# Patient Record
Sex: Female | Born: 1960 | Race: White | Hispanic: No | Marital: Married | Smoking: Current every day smoker
Health system: Southern US, Community
[De-identification: ages and names within clinical notes are randomized; demographics above are authoritative.]

## PROBLEM LIST (undated history)

## (undated) DIAGNOSIS — K859 Acute pancreatitis without necrosis or infection, unspecified: Secondary | ICD-10-CM

## (undated) DIAGNOSIS — J449 Chronic obstructive pulmonary disease, unspecified: Secondary | ICD-10-CM

## (undated) DIAGNOSIS — F1011 Alcohol abuse, in remission: Secondary | ICD-10-CM

## (undated) DIAGNOSIS — F32A Depression, unspecified: Secondary | ICD-10-CM

## (undated) DIAGNOSIS — F329 Major depressive disorder, single episode, unspecified: Secondary | ICD-10-CM

## (undated) DIAGNOSIS — I10 Essential (primary) hypertension: Secondary | ICD-10-CM

## (undated) DIAGNOSIS — F419 Anxiety disorder, unspecified: Secondary | ICD-10-CM

## (undated) HISTORY — PX: TUBAL LIGATION: SHX77

## (undated) HISTORY — DX: Chronic obstructive pulmonary disease, unspecified: J44.9

## (undated) HISTORY — DX: Alcohol abuse, in remission: F10.11

---

## 2000-06-07 ENCOUNTER — Encounter: Admission: RE | Admit: 2000-06-07 | Discharge: 2000-09-05 | Payer: Self-pay | Admitting: Anesthesiology

## 2000-06-15 ENCOUNTER — Encounter: Admission: RE | Admit: 2000-06-15 | Discharge: 2000-09-13 | Payer: Self-pay | Admitting: Anesthesiology

## 2000-09-05 ENCOUNTER — Encounter: Admission: RE | Admit: 2000-09-05 | Discharge: 2000-12-04 | Payer: Self-pay | Admitting: Anesthesiology

## 2014-07-08 ENCOUNTER — Emergency Department (HOSPITAL_COMMUNITY): Payer: 59

## 2014-07-08 ENCOUNTER — Inpatient Hospital Stay (HOSPITAL_COMMUNITY)
Admission: EM | Admit: 2014-07-08 | Discharge: 2014-07-11 | DRG: 440 | Disposition: A | Discharge status: Home / Self Care | Payer: 59 | Attending: Internal Medicine | Admitting: Internal Medicine

## 2014-07-08 ENCOUNTER — Encounter (HOSPITAL_COMMUNITY): Payer: Self-pay | Admitting: Emergency Medicine

## 2014-07-08 DIAGNOSIS — E785 Hyperlipidemia, unspecified: Secondary | ICD-10-CM | POA: Diagnosis present

## 2014-07-08 DIAGNOSIS — R109 Unspecified abdominal pain: Secondary | ICD-10-CM

## 2014-07-08 DIAGNOSIS — K850 Idiopathic acute pancreatitis: Secondary | ICD-10-CM

## 2014-07-08 DIAGNOSIS — Z7901 Long term (current) use of anticoagulants: Secondary | ICD-10-CM

## 2014-07-08 DIAGNOSIS — N19 Unspecified kidney failure: Secondary | ICD-10-CM | POA: Diagnosis present

## 2014-07-08 DIAGNOSIS — F419 Anxiety disorder, unspecified: Secondary | ICD-10-CM | POA: Diagnosis present

## 2014-07-08 DIAGNOSIS — R112 Nausea with vomiting, unspecified: Secondary | ICD-10-CM

## 2014-07-08 DIAGNOSIS — G8929 Other chronic pain: Secondary | ICD-10-CM | POA: Diagnosis present

## 2014-07-08 DIAGNOSIS — Z885 Allergy status to narcotic agent status: Secondary | ICD-10-CM

## 2014-07-08 DIAGNOSIS — K8020 Calculus of gallbladder without cholecystitis without obstruction: Secondary | ICD-10-CM | POA: Diagnosis present

## 2014-07-08 DIAGNOSIS — E781 Pure hyperglyceridemia: Secondary | ICD-10-CM | POA: Diagnosis present

## 2014-07-08 DIAGNOSIS — K861 Other chronic pancreatitis: Secondary | ICD-10-CM | POA: Diagnosis present

## 2014-07-08 DIAGNOSIS — K859 Acute pancreatitis without necrosis or infection, unspecified: Secondary | ICD-10-CM

## 2014-07-08 DIAGNOSIS — K851 Biliary acute pancreatitis: Secondary | ICD-10-CM | POA: Diagnosis present

## 2014-07-08 DIAGNOSIS — F17210 Nicotine dependence, cigarettes, uncomplicated: Secondary | ICD-10-CM | POA: Diagnosis present

## 2014-07-08 DIAGNOSIS — D72829 Elevated white blood cell count, unspecified: Secondary | ICD-10-CM

## 2014-07-08 DIAGNOSIS — I10 Essential (primary) hypertension: Secondary | ICD-10-CM | POA: Diagnosis present

## 2014-07-08 DIAGNOSIS — R1012 Left upper quadrant pain: Secondary | ICD-10-CM

## 2014-07-08 DIAGNOSIS — M545 Low back pain: Secondary | ICD-10-CM | POA: Diagnosis present

## 2014-07-08 HISTORY — DX: Acute pancreatitis without necrosis or infection, unspecified: K85.90

## 2014-07-08 HISTORY — DX: Nausea with vomiting, unspecified: R11.2

## 2014-07-08 LAB — COMPREHENSIVE METABOLIC PANEL
ALT: 61 U/L — AB (ref 0–35)
AST: 44 U/L — AB (ref 0–37)
Albumin: 4 g/dL (ref 3.5–5.2)
Alkaline Phosphatase: 250 U/L — ABNORMAL HIGH (ref 39–117)
Anion gap: 18 — ABNORMAL HIGH (ref 5–15)
BILIRUBIN TOTAL: 0.4 mg/dL (ref 0.3–1.2)
BUN: 6 mg/dL (ref 6–23)
CO2: 23 meq/L (ref 19–32)
Calcium: 9.7 mg/dL (ref 8.4–10.5)
Chloride: 96 mEq/L (ref 96–112)
Creatinine, Ser: 0.59 mg/dL (ref 0.50–1.10)
GFR calc Af Amer: >90 mL/min (ref >90)
GLUCOSE: 151 mg/dL — AB (ref 70–99)
Potassium: 4.5 mEq/L (ref 3.7–5.3)
SODIUM: 137 meq/L (ref 137–147)
Total Protein: 7.9 g/dL (ref 6.0–8.3)

## 2014-07-08 LAB — CBC WITH DIFFERENTIAL/PLATELET
Basophils Absolute: 0 10*3/uL (ref 0.0–0.1)
Basophils Relative: 0 % (ref 0–1)
EOS PCT: 0 % (ref 0–5)
Eosinophils Absolute: 0 10*3/uL (ref 0.0–0.7)
HCT: 39.9 % (ref 36.0–46.0)
Hemoglobin: 13.3 g/dL (ref 12.0–15.0)
LYMPHS ABS: 1.7 10*3/uL (ref 0.7–4.0)
Lymphocytes Relative: 7 % — ABNORMAL LOW (ref 12–46)
MCH: 31.7 pg (ref 26.0–34.0)
MCHC: 33.3 g/dL (ref 30.0–36.0)
MCV: 95 fL (ref 78.0–100.0)
MONO ABS: 0.6 10*3/uL (ref 0.1–1.0)
Monocytes Relative: 2 % — ABNORMAL LOW (ref 3–12)
NEUTROS ABS: 22.1 10*3/uL — AB (ref 1.7–7.7)
Neutrophils Relative %: 91 % — ABNORMAL HIGH (ref 43–77)
Platelets: 402 10*3/uL — ABNORMAL HIGH (ref 150–400)
RBC: 4.2 MIL/uL (ref 3.87–5.11)
RDW: 13.8 % (ref 11.5–15.5)
WBC: 24.5 10*3/uL — AB (ref 4.0–10.5)

## 2014-07-08 LAB — URINE MICROSCOPIC-ADD ON

## 2014-07-08 LAB — LIPASE, BLOOD: Lipase: 1082 U/L — ABNORMAL HIGH (ref 11–59)

## 2014-07-08 LAB — URINALYSIS, ROUTINE W REFLEX MICROSCOPIC
GLUCOSE, UA: NEGATIVE mg/dL
Ketones, ur: 15 mg/dL — AB
Leukocytes, UA: NEGATIVE
Nitrite: NEGATIVE
PH: 6 (ref 5.0–8.0)
Protein, ur: NEGATIVE mg/dL
Specific Gravity, Urine: >1.03 — ABNORMAL HIGH (ref 1.005–1.030)
Urobilinogen, UA: 0.2 mg/dL (ref 0.0–1.0)

## 2014-07-08 MED ORDER — SODIUM CHLORIDE 0.9 % IV SOLN
INTRAVENOUS | Status: DC
  Administered 2014-07-08 – 2014-07-09 (×2): via INTRAVENOUS

## 2014-07-08 MED ORDER — SODIUM CHLORIDE 0.9 % IV BOLUS (SEPSIS)
500.0000 mL | Freq: Once | INTRAVENOUS | Status: AC
  Administered 2014-07-08: 500 mL via INTRAVENOUS

## 2014-07-08 MED ORDER — ONDANSETRON HCL 4 MG/2ML IJ SOLN: 4.0000 mg | Freq: Three times a day (TID) | INTRAMUSCULAR | Status: DC | PRN

## 2014-07-08 MED ORDER — MORPHINE SULFATE 4 MG/ML IJ SOLN
4.0000 mg | Freq: Once | INTRAMUSCULAR | Status: AC
  Administered 2014-07-08: 4 mg via INTRAVENOUS
  Filled 2014-07-08: qty 1

## 2014-07-08 MED ORDER — HYDROMORPHONE HCL 1 MG/ML IJ SOLN: 1.0000 mg | INTRAMUSCULAR | Status: DC | PRN

## 2014-07-08 MED ORDER — ALPRAZOLAM 0.5 MG PO TABS
0.5000 mg | ORAL_TABLET | Freq: Three times a day (TID) | ORAL | Status: DC | PRN
  Administered 2014-07-10: 0.5 mg via ORAL
  Filled 2014-07-08: qty 1

## 2014-07-08 MED ORDER — SODIUM CHLORIDE 0.9 % IV SOLN: INTRAVENOUS | Status: DC

## 2014-07-08 MED ORDER — IOHEXOL 300 MG/ML  SOLN
50.0000 mL | Freq: Once | INTRAMUSCULAR | Status: AC | PRN
  Administered 2014-07-08: 25 mL via ORAL

## 2014-07-08 MED ORDER — HYDROMORPHONE HCL 1 MG/ML IJ SOLN
1.0000 mg | Freq: Once | INTRAMUSCULAR | Status: AC
  Administered 2014-07-08: 1 mg via INTRAVENOUS
  Filled 2014-07-08: qty 1

## 2014-07-08 MED ORDER — ONDANSETRON HCL 4 MG/2ML IJ SOLN
4.0000 mg | Freq: Once | INTRAMUSCULAR | Status: AC
  Administered 2014-07-08: 4 mg via INTRAVENOUS
  Filled 2014-07-08: qty 2

## 2014-07-08 MED ORDER — PAROXETINE HCL 20 MG PO TABS
40.0000 mg | ORAL_TABLET | Freq: Every day | ORAL | Status: DC
  Administered 2014-07-08 – 2014-07-10 (×3): 40 mg via ORAL
  Filled 2014-07-08 (×3): qty 2

## 2014-07-08 MED ORDER — ACETAMINOPHEN 325 MG PO TABS: 650.0000 mg | ORAL_TABLET | Freq: Four times a day (QID) | ORAL | Status: DC | PRN

## 2014-07-08 MED ORDER — HEPARIN SODIUM (PORCINE) 5000 UNIT/ML IJ SOLN
5000.0000 [IU] | Freq: Three times a day (TID) | INTRAMUSCULAR | Status: DC
Start: 2014-07-08 — End: 2014-07-11
  Administered 2014-07-08 – 2014-07-11 (×9): 5000 [IU] via SUBCUTANEOUS
  Filled 2014-07-08 (×9): qty 1

## 2014-07-08 MED ORDER — HYDRALAZINE HCL 20 MG/ML IJ SOLN
10.0000 mg | Freq: Four times a day (QID) | INTRAMUSCULAR | Status: DC | PRN
  Administered 2014-07-09: 10 mg via INTRAVENOUS
  Filled 2014-07-08: qty 1

## 2014-07-08 MED ORDER — SODIUM CHLORIDE 0.9 % IJ SOLN
INTRAMUSCULAR | Status: AC
  Filled 2014-07-08: qty 750

## 2014-07-08 MED ORDER — NICOTINE 21 MG/24HR TD PT24
21.0000 mg | MEDICATED_PATCH | Freq: Every day | TRANSDERMAL | Status: DC
  Administered 2014-07-08 – 2014-07-11 (×4): 21 mg via TRANSDERMAL
  Filled 2014-07-08 (×4): qty 1

## 2014-07-08 MED ORDER — ONDANSETRON HCL 4 MG/2ML IJ SOLN
4.0000 mg | Freq: Four times a day (QID) | INTRAMUSCULAR | Status: DC | PRN
  Administered 2014-07-09: 4 mg via INTRAVENOUS
  Filled 2014-07-08: qty 2

## 2014-07-08 MED ORDER — ACETAMINOPHEN 650 MG RE SUPP: 650.0000 mg | Freq: Four times a day (QID) | RECTAL | Status: DC | PRN

## 2014-07-08 MED ORDER — IOHEXOL 300 MG/ML  SOLN
100.0000 mL | Freq: Once | INTRAMUSCULAR | Status: AC | PRN
  Administered 2014-07-08: 100 mL via INTRAVENOUS

## 2014-07-08 MED ORDER — ONDANSETRON HCL 4 MG PO TABS: 4.0000 mg | ORAL_TABLET | Freq: Four times a day (QID) | ORAL | Status: DC | PRN

## 2014-07-08 MED ORDER — HYDROMORPHONE HCL 1 MG/ML IJ SOLN
1.0000 mg | INTRAMUSCULAR | Status: DC | PRN
  Administered 2014-07-08 – 2014-07-10 (×8): 1 mg via INTRAVENOUS
  Filled 2014-07-08 (×8): qty 1

## 2014-07-08 NOTE — H&P (Signed)
[facility] History and Physical  Hailey BusmanCatherine Darsey WUJ:[id number] DOB: 1961/05/24 DOA: 07/08/2014  Referring physician: Dr. Blinda LeatherwoodPollina ER physician PCP: Selinda FlavinHOWARD, KEVIN, MD   Chief Complaint: Abdominal pain  HPI: Hailey BusmanCatherine Greer is a 53 y.o. female who was in her usual state of health when this morning she developed epigastric size. Umbilical abdominal pain. This was associated with frequent episodes of vomiting. She also describes some loose stools which may have contained some blood. She denies any fever, shortness of breath, chest pain. She has a chronic cough which is unchanged. She denies any dysuria. She's not had any sick contacts. She was evaluated in emergency room where CT scan of the abdomen confirmed evidence of pancreatitis. Lipase was also noted to be greater than 1000. Patient reports that she does not drink any alcohol. She was started on Lipitor approximately 2 months ago. No other new medications. The patient's first episode of pancreatitis was in 03/2013. At that time she was found to have a hemorrhagic pancreatitis and was critically ill. [facility] and was admitted for approximately 2-1/2 weeks. During that hospital course, she was intubated and had developed significant renal failure. Since that episode of pancreatitis, this is the only other episode that she has developed. She'll be admitted for further treatments   Review of Systems:  Pertinent positives as per HPI, otherwise negative  Past Medical History  Diagnosis Date  . Pancreatitis    Past Surgical History  Procedure Laterality Date  . Tubal ligation     Social History:  reports that she has been smoking.  She does not have any smokeless tobacco history on file. She reports that she does not drink alcohol or use illicit drugs.  Allergies  Allergen Reactions  . Codeine     hives    Family history: no family history of pancreatitis   Prior to Admission medications     Medication Sig Start Date End Date Taking? Authorizing Provider  ALPRAZolam Prudy Feeler(XANAX) 0.5 MG tablet Take 0.5 mg by mouth 3 (three) times daily as needed. sleep   Yes Historical Provider, MD  atorvastatin (LIPITOR) 20 MG tablet Take 20 mg by mouth daily.   Yes Historical Provider, MD  PARoxetine (PAXIL) 40 MG tablet Take 40 mg by mouth at bedtime.    Yes Historical Provider, MD   Physical Exam: Filed Vitals:   07/08/14 1513 07/08/14 1600 07/08/14 1727 07/08/14 1730  BP: 192/98 196/102 178/99 183/95  Pulse: 82 82 83 84  Temp:      TempSrc:      Resp: 18  20   Height:      Weight:      SpO2: 100% 96% 98% 97%    Wt Readings from Last 3 Encounters:  07/08/14 63.05 kg (139 lb)    General:  Appears calm and comfortable Eyes: PERRL, normal lids, irises & conjunctiva ENT: grossly normal hearing, lips & tongue Neck: no LAD, masses or thyromegaly Cardiovascular: RRR, no m/r/g. No LE edema. Telemetry: SR, no arrhythmias  Respiratory: CTA bilaterally, no w/r/r. Normal respiratory effort. Abdomen: soft, tenderness in epigastrium/LUQ and periumbilical area, bs+ Skin: no rash or induration seen on limited exam Musculoskeletal: grossly normal tone BUE/BLE Psychiatric: grossly normal mood and affect, speech fluent and appropriate Neurologic: grossly non-focal.          Labs on Admission:  Basic Metabolic Panel:  Recent Labs Lab 07/08/14 1323  NA 137  K 4.5  CL 96  CO2 23  GLUCOSE 151*  BUN 6  CREATININE 0.59  CALCIUM 9.7   Liver Function Tests:  Recent Labs Lab 07/08/14 1323  AST 44*  ALT 61*  ALKPHOS 250*  BILITOT 0.4  PROT 7.9  ALBUMIN 4.0    Recent Labs Lab 07/08/14 1323  LIPASE 1082*   No results for input(s): AMMONIA in the last 168 hours. CBC:  Recent Labs Lab 07/08/14 1323  WBC 24.5*  NEUTROABS 22.1*  HGB 13.3  HCT 39.9  MCV 95.0  PLT 402*   Cardiac Enzymes: No results for input(s): CKTOTAL, CKMB, CKMBINDEX, TROPONINI in the last 168  hours.  BNP (last 3 results) No results for input(s): PROBNP in the last 8760 hours. CBG: No results for input(s): GLUCAP in the last 168 hours.  Radiological Exams on Admission: Ct Abdomen Pelvis W Contrast  07/08/2014   CLINICAL DATA:  Left upper quadrant pain with nausea, vomiting, diarrhea. Patient vomited oral contrast.  EXAM: CT ABDOMEN AND PELVIS WITH CONTRAST  TECHNIQUE: Multidetector CT imaging of the abdomen and pelvis was performed using the standard protocol following bolus administration of intravenous contrast.  CONTRAST:  25mL OMNIPAQUE IOHEXOL 300 MG/ML SOLN, OMNIPAQUE IOHEXOL 300 MG/ML SOLN  COMPARISON:  CT 04/01/2013  FINDINGS: Linear atelectasis or scarring in the right middle lobe and lingula.  Peripancreatic inflammatory change about the neck and body of the pancreas, this appears less severe than on prior exam. There is ill-defined peripancreatic fluid at the pancreatic tail, right and left retroperitoneum. Small amount of free fluid seen in the left upper quadrant of the abdomen. There is no frank organized peripancreatic pseudocyst, however right retroperitoneal collection measures 4.5 x 1.7 x 2.9 cm, is complex with questionable peripheral enhancement and may reflect developing organized collection.  Hepatic steatosis again seen. The high-density material in the gallbladder on prior exam is less well appreciated. Spleen, adrenal glands, and kidneys appear normal. Fluid filled hepatic flexure of the colon may be reactive. Bowel loops are otherwise normal. No obstruction. The abdominal aorta is normal in caliber with mild moderate atherosclerosis.  Within the pelvis the bladder is physiologically distended. There is prominent uterine vascularity. No adnexal mass. No pelvic free fluid or ascites. The appendix is normal.  No acute or suspicious osseous abnormality.  IMPRESSION: 1. Acute pancreatitis. There is fluid in the right and left retroperitoneum and about the tail of the  pancreas. On the right, the fluid is complex with mild peripheral enhancement; question early organized collection. 2. Hepatic steatosis.   Electronically Signed   By: Rubye Oaks M.D.   On: 07/08/2014 16:32    Assessment/Plan Active Problems:   Pancreatitis   Hypertension   Nausea & vomiting   Leukocytosis   Pancreatitis, acute   1. Acute pancreatitis. Etiology is not entirely clear. She denies any alcohol use. Will obtain right upper quadrant ultrasound, although CT scan does not show any evidence of cholelithiasis. Her LFTs are mildly elevated. She's also recently started on Lipitor which in some cases can lead to pancreatitis. We will hold this for now. CT scan of the abdomen and pelvis does indicate developing fluid collections. We will ask gastroenterology to follow. She'll be treated conservatively with bowel rest, kept nothing by mouth and receive IV fluids as well as pain management. Diet will be advanced as tolerated 2. Leukocytosis. Reactive to #1. Continue to follow 3. Nausea and vomiting. Related to #1. Continue antiemetics. 4. Hypertension. Patient has history of hypertension and her blood pressure was likely exacerbated by  pain. We'll continue her home regimen and use when necessary hydralazine.   Code Status: full code DVT Prophylaxis: heparin sq Family Communication: discussed with patient Disposition Plan: discharge home once improved  Time spent: 60mins  [facility] LLCMEMON,JEHANZEB [facility] Pager [phone]

## 2014-07-08 NOTE — ED Provider Notes (Signed)
CSN: [id number]     Arrival date & time 07/08/14  1241 History  This chart was scribed for Gilda Crease, MD  by Abel Presto, ED Scribe. This patient was seen in room APA02/APA02 and the patient's care was started at 1:43 PM.    Chief Complaint  Patient presents with  . Abdominal Pain    Patient is a 53 y.o. female presenting with abdominal pain. The history is provided by the patient. No language interpreter was used.  Abdominal Pain Associated symptoms: chills, cough (mild), diarrhea and vomiting   Associated symptoms: no fever and no sore throat     HPI Comments: Hailey Greer is a 53 y.o. female who presents to the Emergency Department complaining of LUQ and epigastric abdominal pain with onset today. Pt notes associated vomiting, diarrhea with trace blood and chills. Pt notes she has also has a mild cough but denies any other cold symptoms. Pt states pain is similar to previous pancreatitis.  Pt denies PMHx of colitis and diverticulitis.  Pt denies fever.  Past Medical History  Diagnosis Date  . Pancreatitis    Past Surgical History  Procedure Laterality Date  . Tubal ligation     History reviewed. No pertinent family history. History  Substance Use Topics  . Smoking status: Current Every Day Smoker -- 1.00 packs/day  . Smokeless tobacco: Not on file  . Alcohol Use: No   OB History    No data available     Review of Systems  Constitutional: Positive for chills. Negative for fever.  HENT: Negative for rhinorrhea and sore throat.   Respiratory: Positive for cough (mild).   Gastrointestinal: Positive for vomiting, abdominal pain, diarrhea and blood in stool.  All other systems reviewed and are negative.     Allergies  Codeine  Home Medications   Prior to Admission medications   Medication Sig Start Date End Date Taking? Authorizing Provider  ALPRAZolam Prudy Feeler) 0.5 MG tablet Take 0.5 mg by mouth 3 (three) times daily as needed. sleep   Yes  Historical Provider, MD  atorvastatin (LIPITOR) 20 MG tablet Take 20 mg by mouth daily.   Yes Historical Provider, MD  PARoxetine (PAXIL) 40 MG tablet Take 40 mg by mouth at bedtime.    Yes Historical Provider, MD   BP 192/98 mmHg  Pulse 82  Temp(Src) 97.8 F (36.6 C) (Oral)  Resp 18  Ht 5' 3.5" (1.613 m)  Wt 139 lb (63.05 kg)  BMI 24.23 kg/m2  SpO2 100% Physical Exam  Constitutional: She is oriented to person, place, and time. She appears well-developed and well-nourished. No distress.  HENT:  Head: Normocephalic and atraumatic.  Right Ear: Hearing normal.  Left Ear: Hearing normal.  Nose: Nose normal.  Mouth/Throat: Oropharynx is clear and moist and mucous membranes are normal.  Eyes: Conjunctivae and EOM are normal. Pupils are equal, round, and reactive to light.  Neck: Normal range of motion. Neck supple.  Cardiovascular: Regular rhythm, S1 normal and S2 normal.  Exam reveals no gallop and no friction rub.   No murmur heard. Pulmonary/Chest: Effort normal and breath sounds normal. No respiratory distress. She exhibits no tenderness.  Abdominal: Soft. Normal appearance and bowel sounds are normal. There is no hepatosplenomegaly. There is no tenderness. There is no rebound, no guarding, no tenderness at McBurney's point and negative Murphy's sign. No hernia.  Musculoskeletal: Normal range of motion.  Neurological: She is alert and oriented to person, place, and time. She has normal strength. No cranial  nerve deficit or sensory deficit. Coordination normal. GCS eye subscore is 4. GCS verbal subscore is 5. GCS motor subscore is 6.  Skin: Skin is warm, dry and intact. No rash noted. No cyanosis.  Psychiatric: She has a normal mood and affect. Her speech is normal and behavior is normal. Thought content normal.  Nursing note and vitals reviewed.   ED Course  Procedures (including critical care time) DIAGNOSTIC STUDIES: Oxygen Saturation is 100% on room air, normal by my  interpretation.    COORDINATION OF CARE: 1:45 PM Discussed treatment plan with patient at beside, the patient agrees with the plan and has no further questions at this time.   Labs Review Labs Reviewed  CBC WITH DIFFERENTIAL - Abnormal; Notable for the following:    WBC 24.5 (*)    Platelets 402 (*)    Neutrophils Relative % 91 (*)    Neutro Abs 22.1 (*)    Lymphocytes Relative 7 (*)    Monocytes Relative 2 (*)    All other components within normal limits  COMPREHENSIVE METABOLIC PANEL - Abnormal; Notable for the following:    Glucose, Bld 151 (*)    AST 44 (*)    ALT 61 (*)    Alkaline Phosphatase 250 (*)    Anion gap 18 (*)    All other components within normal limits  LIPASE, BLOOD - Abnormal; Notable for the following:    Lipase 1082 (*)    All other components within normal limits  URINALYSIS, ROUTINE W REFLEX MICROSCOPIC - Abnormal; Notable for the following:    APPearance HAZY (*)    Specific Gravity, Urine >1.030 (*)    Hgb urine dipstick SMALL (*)    Bilirubin Urine SMALL (*)    Ketones, ur 15 (*)    All other components within normal limits  URINE MICROSCOPIC-ADD ON - Abnormal; Notable for the following:    Squamous Epithelial / LPF FEW (*)    Bacteria, UA MANY (*)    Casts GRANULAR CAST (*)    All other components within normal limits  POC OCCULT BLOOD, ED    Imaging Review Ct Abdomen Pelvis W Contrast  07/08/2014   CLINICAL DATA:  Left upper quadrant pain with nausea, vomiting, diarrhea. Patient vomited oral contrast.  EXAM: CT ABDOMEN AND PELVIS WITH CONTRAST  TECHNIQUE: Multidetector CT imaging of the abdomen and pelvis was performed using the standard protocol following bolus administration of intravenous contrast.  CONTRAST:  25mL OMNIPAQUE IOHEXOL 300 MG/ML SOLN, 100mL OMNIPAQUE IOHEXOL 300 MG/ML SOLN  COMPARISON:  CT 04/01/2013  FINDINGS: Linear atelectasis or scarring in the right middle lobe and lingula.  Peripancreatic inflammatory change about the neck  and body of the pancreas, this appears less severe than on prior exam. There is ill-defined peripancreatic fluid at the pancreatic tail, right and left retroperitoneum. Small amount of free fluid seen in the left upper quadrant of the abdomen. There is no frank organized peripancreatic pseudocyst, however right retroperitoneal collection measures 4.5 x 1.7 x 2.9 cm, is complex with questionable peripheral enhancement and may reflect developing organized collection.  Hepatic steatosis again seen. The high-density material in the gallbladder on prior exam is less well appreciated. Spleen, adrenal glands, and kidneys appear normal. Fluid filled hepatic flexure of the colon may be reactive. Bowel loops are otherwise normal. No obstruction. The abdominal aorta is normal in caliber with mild moderate atherosclerosis.  Within the pelvis the bladder is physiologically distended. There is prominent uterine vascularity. No adnexal mass.  No pelvic free fluid or ascites. The appendix is normal.  No acute or suspicious osseous abnormality.  IMPRESSION: 1. Acute pancreatitis. There is fluid in the right and left retroperitoneum and about the tail of the pancreas. On the right, the fluid is complex with mild peripheral enhancement; question early organized collection. 2. Hepatic steatosis.   Electronically Signed   By: Rubye OaksMelanie  Ehinger M.D.   On: 07/08/2014 16:32     EKG Interpretation None      MDM   Final diagnoses:  Abdominal pain  Acute pancreatitis, unspecified pancreatitis type   She presents to the ER for evaluation of abdominal pain. Patient having pain diffusely on the left side of her abdomen that radiates into her back. She has had only partial improvement with multiple doses of morphine and Zofran. Lipase is above 1000. CT scan shows uncomplicated pancreatitis. Patient reports a previous history of pancreatitis, unclear etiology. She will be admitted to the hospital for further management.  I personally  performed the services described in this documentation, which was scribed in my presence. The recorded information has been reviewed and is accurate.      Gilda Creasehristopher J. Pollina, MD 07/08/14 [phone]

## 2014-07-08 NOTE — ED Notes (Signed)
Dr Blinda LeatherwoodPollina informed to elevated BP.  See new orders.

## 2014-07-08 NOTE — ED Notes (Signed)
Tammy in CT notified pt vomited contrast back.

## 2014-07-08 NOTE — ED Notes (Signed)
Oral contrast completed.

## 2014-07-08 NOTE — ED Notes (Addendum)
History of pancreatitis.  Pain started this am to left upper abdomen, rates pain 6.  Pain to mid back, rates pain 4.  Took Xanax one tablet at 09:00.

## 2014-07-08 NOTE — ED Notes (Signed)
Pt reports abdominal pain,vomiting and blood tinged diarrhea since this am.

## 2014-07-09 ENCOUNTER — Inpatient Hospital Stay (HOSPITAL_COMMUNITY): Payer: 59

## 2014-07-09 DIAGNOSIS — R112 Nausea with vomiting, unspecified: Secondary | ICD-10-CM

## 2014-07-09 DIAGNOSIS — K859 Acute pancreatitis, unspecified: Secondary | ICD-10-CM

## 2014-07-09 DIAGNOSIS — R1013 Epigastric pain: Secondary | ICD-10-CM

## 2014-07-09 DIAGNOSIS — K851 Biliary acute pancreatitis: Principal | ICD-10-CM

## 2014-07-09 LAB — COMPREHENSIVE METABOLIC PANEL
ALT: 43 U/L — AB (ref 0–35)
AST: 27 U/L (ref 0–37)
Albumin: 3.5 g/dL (ref 3.5–5.2)
Alkaline Phosphatase: 175 U/L — ABNORMAL HIGH (ref 39–117)
Anion gap: 9 (ref 5–15)
BUN: <5 mg/dL — ABNORMAL LOW (ref 6–23)
CALCIUM: 9.1 mg/dL (ref 8.4–10.5)
CO2: 25 mmol/L (ref 19–32)
Chloride: 100 mEq/L (ref 96–112)
Creatinine, Ser: 0.57 mg/dL (ref 0.50–1.10)
GFR calc non Af Amer: >90 mL/min (ref >90)
Glucose, Bld: 122 mg/dL — ABNORMAL HIGH (ref 70–99)
Potassium: 3.3 mmol/L — ABNORMAL LOW (ref 3.5–5.1)
SODIUM: 134 mmol/L — AB (ref 135–145)
TOTAL PROTEIN: 7 g/dL (ref 6.0–8.3)
Total Bilirubin: 0.7 mg/dL (ref 0.3–1.2)

## 2014-07-09 LAB — CBC
HCT: 37.9 % (ref 36.0–46.0)
HEMOGLOBIN: 12.8 g/dL (ref 12.0–15.0)
MCH: 31.5 pg (ref 26.0–34.0)
MCHC: 33.8 g/dL (ref 30.0–36.0)
MCV: 93.3 fL (ref 78.0–100.0)
Platelets: 364 10*3/uL (ref 150–400)
RBC: 4.06 MIL/uL (ref 3.87–5.11)
RDW: 13.7 % (ref 11.5–15.5)
WBC: 20.7 10*3/uL — AB (ref 4.0–10.5)

## 2014-07-09 LAB — LIPID PANEL
CHOLESTEROL: 229 mg/dL — AB (ref 0–200)
HDL: 64 mg/dL (ref >39)
LDL Cholesterol: UNDETERMINED mg/dL (ref 0–99)
TRIGLYCERIDES: 592 mg/dL — AB (ref <150)
Total CHOL/HDL Ratio: 3.6 RATIO
VLDL: UNDETERMINED mg/dL (ref 0–40)

## 2014-07-09 LAB — LIPASE, BLOOD: LIPASE: 353 U/L — AB (ref 11–59)

## 2014-07-09 MED ORDER — CEFTRIAXONE SODIUM IN DEXTROSE 20 MG/ML IV SOLN
1.0000 g | INTRAVENOUS | Status: DC
  Administered 2014-07-09 – 2014-07-11 (×3): 1 g via INTRAVENOUS
  Filled 2014-07-09 (×7): qty 50

## 2014-07-09 MED ORDER — SODIUM CHLORIDE 0.9 % IV SOLN
INTRAVENOUS | Status: DC
  Filled 2014-07-09 (×3): qty 1000

## 2014-07-09 MED ORDER — POTASSIUM CHLORIDE 10 MEQ/100ML IV SOLN
10.0000 meq | INTRAVENOUS | Status: AC
  Administered 2014-07-09 (×3): 10 meq via INTRAVENOUS
  Filled 2014-07-09 (×2): qty 100

## 2014-07-09 MED ORDER — POTASSIUM CHLORIDE IN NACL 40-0.9 MEQ/L-% IV SOLN
INTRAVENOUS | Status: DC
  Administered 2014-07-09 – 2014-07-11 (×3): 100 mL/h via INTRAVENOUS

## 2014-07-09 NOTE — Progress Notes (Signed)
UR chart review completed.  

## 2014-07-09 NOTE — Care Management Note (Addendum)
    Page 1 of 1   07/11/2014     1:48:28 PM CARE MANAGEMENT NOTE 07/11/2014  Patient:  Hailey Greer,Hailey Greer   Account Number:  [id number]  Date Initiated:  07/09/2014  Documentation initiated by:  Sharrie RothmanBLACKWELL,Kennedy Bohanon C  Subjective/Objective Assessment:   Pt admitted from home with pancreatitis. Pt lives with her husband and will returnhome at discharge. Pt is independent with ADL's.     Action/Plan:   No CM needs noted.   Anticipated DC Date:  07/11/2014   Anticipated DC Plan:  HOME/SELF CARE      DC Planning Services  CM consult      Choice offered to / List presented to:             Status of service:  Completed, signed off Medicare Important Message given?   (If response is "NO", the following Medicare IM given date fields will be blank) Date Medicare IM given:   Medicare IM given by:   Date Additional Medicare IM given:   Additional Medicare IM given by:    Discharge Disposition:  HOME/SELF CARE  Per UR Regulation:    If discussed at Long Length of Stay Meetings, dates discussed:    Comments:  07/11/14 1345 Arlyss Queenammy Chiquitta Matty, RN BSN CM Pt discharged home today. No Cm needs noted.  07/09/14 1410 Arlyss Queenammy Kahlan Engebretson, RN BSN CM

## 2014-07-09 NOTE — Progress Notes (Signed)
[facility] PROGRESS NOTE  Hailey BusmanCatherine Greer ZOX:[id number] DOB: 10/20/1960 DOA: 07/08/2014 PCP: Selinda FlavinHOWARD, KEVIN, MD  Assessment/Plan: 1. Acute biliary pancreatitis. Ultrasound of the gallbladder indicates evidence of cholelithiasis. She will likely need elective cholecystectomy. She's been seen by general surgery with recommendations for outpatient follow-up. Lipase is currently trending down. She'll be kept nothing by mouth for today and likely start clear liquids tomorrow. Continue supportive management with IV fluids and pain management. 2. Hypertension. Continue current treatments. Blood pressure stable. 3. Nausea and vomiting. Related to #1. Improving. 4. Leukocytosis. Reactive to #1. Improving. Continue to follow  Code Status: full code Family Communication: discussed with patient and husband at the bedside Disposition Plan: discharge home once improved   Consultants:  Gastroenterology  General Surgery  Procedures:    Antibiotics:    HPI/Subjective: Feeling better today. Abdominal pain has improved, no vomiting  Objective: Filed Vitals:   07/09/14 1429  BP: 162/74  Pulse: 92  Temp: 98.4 F (36.9 C)  Resp: 18    Intake/Output Summary (Last 24 hours) at 07/09/14 1511 Last data filed at 07/09/14 1435  Gross per 24 hour  Intake 1060.42 ml  Output    502 ml  Net 558.42 ml   Filed Weights   07/08/14 1248 07/08/14 1944  Weight: 63.05 kg (139 lb) 63.05 kg (139 lb)    Exam:   General:  NAD  Cardiovascular: S1, S2 RRR  Respiratory: CTA B  Abdomen: soft, mild tenderness in LUQ/epigastrium, bs+  Musculoskeletal: no edema b/l   Data Reviewed: Basic Metabolic Panel:  Recent Labs Lab 07/08/14 1323 07/09/14 0540  NA 137 134*  K 4.5 3.3*  CL 96 100  CO2 23 25  GLUCOSE 151* 122*  BUN 6 <5*  CREATININE 0.59 0.57  CALCIUM 9.7 9.1   Liver Function Tests:  Recent Labs Lab 07/08/14 1323 07/09/14 0540  AST 44* 27  ALT 61* 43*  ALKPHOS 250*  175*  BILITOT 0.4 0.7  PROT 7.9 7.0  ALBUMIN 4.0 3.5    Recent Labs Lab 07/08/14 1323 07/09/14 0540  LIPASE 1082* 353*   No results for input(s): AMMONIA in the last 168 hours. CBC:  Recent Labs Lab 07/08/14 1323 07/09/14 0540  WBC 24.5* 20.7*  NEUTROABS 22.1*  --   HGB 13.3 12.8  HCT 39.9 37.9  MCV 95.0 93.3  PLT 402* 364   Cardiac Enzymes: No results for input(s): CKTOTAL, CKMB, CKMBINDEX, TROPONINI in the last 168 hours. BNP (last 3 results) No results for input(s): PROBNP in the last 8760 hours. CBG: No results for input(s): GLUCAP in the last 168 hours.  No results found for this or any previous visit (from the past 240 hour(s)).   Studies: Ct Abdomen Pelvis W Contrast  07/08/2014   CLINICAL DATA:  Left upper quadrant pain with nausea, vomiting, diarrhea. Patient vomited oral contrast.  EXAM: CT ABDOMEN AND PELVIS WITH CONTRAST  TECHNIQUE: Multidetector CT imaging of the abdomen and pelvis was performed using the standard protocol following bolus administration of intravenous contrast.  CONTRAST:  25mL OMNIPAQUE IOHEXOL 300 MG/ML SOLN, 100mL OMNIPAQUE IOHEXOL 300 MG/ML SOLN  COMPARISON:  CT 04/01/2013  FINDINGS: Linear atelectasis or scarring in the right middle lobe and lingula.  Peripancreatic inflammatory change about the neck and body of the pancreas, this appears less severe than on prior exam. There is ill-defined peripancreatic fluid at the pancreatic tail, right and left retroperitoneum. Small amount of free fluid seen in the left upper quadrant of the abdomen. There  is no frank organized peripancreatic pseudocyst, however right retroperitoneal collection measures 4.5 x 1.7 x 2.9 cm, is complex with questionable peripheral enhancement and may reflect developing organized collection.  Hepatic steatosis again seen. The high-density material in the gallbladder on prior exam is less well appreciated. Spleen, adrenal glands, and kidneys appear normal. Fluid filled  hepatic flexure of the colon may be reactive. Bowel loops are otherwise normal. No obstruction. The abdominal aorta is normal in caliber with mild moderate atherosclerosis.  Within the pelvis the bladder is physiologically distended. There is prominent uterine vascularity. No adnexal mass. No pelvic free fluid or ascites. The appendix is normal.  No acute or suspicious osseous abnormality.  IMPRESSION: 1. Acute pancreatitis. There is fluid in the right and left retroperitoneum and about the tail of the pancreas. On the right, the fluid is complex with mild peripheral enhancement; question early organized collection. 2. Hepatic steatosis.   Electronically Signed   By: Rubye OaksMelanie  Ehinger M.D.   On: 07/08/2014 16:32   [place] Abdomen Limited Ruq  07/09/2014   CLINICAL DATA:  Possible pancreatitis, evaluate for common bile duct calculi  EXAM: US ABDOMEN LIMITED - RIGHT UPPER QUADRANT  COMPARISON:  CT abdomen pelvis of 07/08/2014  FINDINGS: Gallbladder:  There is a single non mobile echogenic focus within the gallbladder of 1.1 cm most consistent with gallstone with shadowing. The gallbladder wall is not thickened and there is no pain over the gallbladder with compression.  Common bile duct:  Diameter: The common bile duct is normal measuring 4.4 mm in diameter. No common bile duct calculi are seen by ultrasound.  Liver:  The liver is somewhat echogenic consistent with fatty infiltration. A small amount of fluid is noted just below the liver which may be related to the patient's recent history of pancreatitis.  Bowel gas does obscure some anatomic detail.  IMPRESSION: 1. At least one gallstone of 1.1 cm. No ultrasound evidence of acute cholecystitis is seen. 2. Probable fatty infiltration of the liver. 3. Small amount of fluid caudal to the liver may be related to the patient's recent history of pancreatitis.   Electronically Signed   By: Dwyane DeePaul  Barry M.D.   On: 07/09/2014 10:33    Scheduled Meds: . cefTRIAXone  (ROCEPHIN)  IV  1 g Intravenous Q24H  . heparin  5,000 Units Subcutaneous 3 times per day  . nicotine  21 mg Transdermal Daily  . PARoxetine  40 mg Oral QHS   Continuous Infusions: . sodium chloride 125 mL/hr at 07/09/14 [id number]    Active Problems:   Pancreatitis   Hypertension   Nausea & vomiting   Leukocytosis   Pancreatitis, acute    Time spent:5225mins    MEMON,JEHANZEB  [facility] Pager [phone]. If 7PM-7AM, please contact night-coverage at [email or web address], password [email or web address] 07/09/2014, 3:11 PM  LOS: 1 day

## 2014-07-09 NOTE — Consult Note (Signed)
Referring Provider: Erick BlinksJehanzeb Memon, MD Primary Care Physician:  Selinda FlavinHOWARD, KEVIN, MD Primary Gastroenterologist:  Dr. Karilyn Cotaehman  Reason for Consultation:    Acute pancreatitis.  HPI:   Patient is 53 year old Caucasian female who has history of fulminant pancreatitis(September 2014 for which [facility]) with full recovery was in usual state of felt until yesterday morning when she woke up with pain in epigastrium and right upper quadrant. She felt fine when she went to bed night before. Pain increased in intensity and was associated with nausea vomiting and she also had few loose stools. By this time she needed she had pancreatitis. She was therefore brought to emergency room. Evaluation in emergency room revealed leukocytosis serum lipase of greater than 1000, mildly elevated AST and ALT. Abdominopelvic CT with contrast revealed peripancreatic inflammatory changes surrounding neck and body of pancreas there was ill-defined peripancreatic fluid at pancreatic tail right and left retroperitoneum and fluid collection in right retroperitoneum measuring 45 17 x 29 mm. Patient was admitted to hospitalist service and begun on IV fluids and made nothing by mouth. She just had an ultrasound which have reviewed and it reveals cholelithiasis and normal size CBD without evidence of choledocholithiasis. Patient feels much better. She feels pain is well controlled with medication. She now complains of mild pain or soreness across upper abdomen. She did vomit bilious material during the night. She denies chest pain or shortness of breath. Is also no history of fever or chills. She states she passed a small amount of fresh blood with her bowel movements yesterday when she had diarrhea. She has had similar episode or 2 in the past. She is never been screened for CRC to Dr. Selinda FlavinKevin Greer as recommended but she is declined. Patient has had good appetite she denies recent weight loss. He did lose over 10 pounds  when she was sick last year but she has gained it all back. She does not drink alcohol. Family studies negative for pancreatitis. Patient smokes about a pack of cigarettes per day. All in all she smoked for more than 25 years. At one point she quit for 5 years. She has 2 grownup son in good health.     Past Medical History  Diagnosis Date  .  fulminant pancreatitis back in September 2014; she [facility] [place] [facility] for [facility]2 [facility]. She did receive 3 units of PRBCs. She had multiple CTs in the last one was in March 2015 revealing significant decrease in the size of fluid collections.      Hyperlipidemia. She has elevation of the last straw as well as triglycerides.    Per conversation with Dr. Selinda FlavinKevin Greer triglyceride level was 770 at its peak never above 1000.  History of anxiety.    Chronic low back pain. MRI was negative according to patient and she was felt to have fibromyalgia.  Past Surgical History  Procedure Laterality Date  . Tubal ligation      Prior to Admission medications   Medication Sig Start Date End Date Taking? Authorizing Provider  ALPRAZolam Prudy Feeler(XANAX) 0.5 MG tablet Take 0.5 mg by mouth 3 (three) times daily as needed. sleep   Yes Historical Provider, MD  atorvastatin (LIPITOR) 20 MG tablet Take 20 mg by mouth daily.   Yes Historical Provider, MD  PARoxetine (PAXIL) 40 MG tablet Take 40 mg by mouth at bedtime.    Yes Historical Provider, MD    Current Facility-Administered Medications  Medication Dose  Route Frequency Provider Last Rate Last Dose  . 0.9 %  sodium chloride infusion   Intravenous Continuous Erick Blinks, MD 125 mL/hr at 07/09/14 0554    . acetaminophen (TYLENOL) tablet 650 mg  650 mg Oral Q6H PRN Erick Blinks, MD       Or  . acetaminophen (TYLENOL) suppository 650 mg  650 mg Rectal Q6H PRN Erick Blinks, MD      . ALPRAZolam Prudy Feeler) tablet 0.5 mg  0.5 mg Oral TID PRN Erick Blinks, MD      . heparin injection 5,000  Units  5,000 Units Subcutaneous 3 times per day Erick Blinks, MD   5,000 Units at 07/09/14 0554  . hydrALAZINE (APRESOLINE) injection 10 mg  10 mg Intravenous Q6H PRN Erick Blinks, MD   10 mg at 07/09/14 0039  . HYDROmorphone (DILAUDID) injection 1 mg  1 mg Intravenous Q4H PRN Erick Blinks, MD   1 mg at 07/09/14 0755  . nicotine (NICODERM CQ - dosed in mg/24 hours) patch 21 mg  21 mg Transdermal Daily Gilda Crease, MD   21 mg at 07/08/14 1738  . ondansetron (ZOFRAN) tablet 4 mg  4 mg Oral Q6H PRN Erick Blinks, MD       Or  . ondansetron (ZOFRAN) injection 4 mg  4 mg Intravenous Q6H PRN Erick Blinks, MD   4 mg at 07/09/14 0327  . PARoxetine (PAXIL) tablet 40 mg  40 mg Oral QHS Erick Blinks, MD   40 mg at 07/08/14 2125    Allergies as of 07/08/2014 - Review Complete 07/08/2014  Allergen Reaction Noted  . Codeine  07/08/2014    History reviewed. No pertinent family history.  History   Social History  . Marital Status: Married    Spouse Name: N/A    Number of Children: N/A  . Years of Education: N/A   Occupational History  . Not on file.   Social History Main Topics  . Smoking status: Current Every Day Smoker -- 1.00 packs/day  . Smokeless tobacco: Not on file  . Alcohol Use: No  . Drug Use: No  . Sexual Activity: Not on file   Other Topics Concern  . Not on file   Social History Narrative  . No narrative on file    Review of Systems: See HPI, otherwise normal ROS  Physical Exam: Temp:  [97.8 F (36.6 C)-98.8 F (37.1 C)] 98.8 F (37.1 C) (12/22 0451) Pulse Rate:  [82-100] 100 (12/22 0451) Resp:  [18-20] 18 (12/22 0451) BP: (142-196)/(74-102) 142/74 mmHg (12/22 0451) SpO2:  [92 %-100 %] 98 % (12/22 0451) Weight:  [139 lb (63.05 kg)] 139 lb (63.05 kg) (12/21 1944) Last BM Date: 07/08/14 Patient is alert and appears to be in no acute distress. Conjunctiva was pink. Sclerae nonicteric. Oropharyngeal mucosa is normal. Dentition in satisfactory  condition. No neck masses or thyromegaly noted. Heart exam with regular rhythm normal S1 and S2. No murmur or gallop noted. Lungs are clear to auscultation. Abdomen is symmetrical. Bowel sounds are normal. On palpation abdomen is soft with mild to moderate tenderness in midepigastric region. No organomegaly or masses. No prior for edema or clubbing noted.   Lab Results:  Recent Labs  07/08/14 1323 07/09/14 0540  WBC 24.5* 20.7*  HGB 13.3 12.8  HCT 39.9 37.9  PLT 402* 364   BMET  Recent Labs  07/08/14 1323 07/09/14 0540  NA 137 134*  K 4.5 3.3*  CL 96 100  CO2 23 25  GLUCOSE 151* 122*  BUN 6 <5*  CREATININE 0.59 0.57  CALCIUM 9.7 9.1   LFT  Recent Labs  07/09/14 0540  PROT 7.0  ALBUMIN 3.5  AST 27  ALT 43*  ALKPHOS 175*  BILITOT 0.7    Studies/Results: Ct Abdomen Pelvis W Contrast  07/08/2014   CLINICAL DATA:  Left upper quadrant pain with nausea, vomiting, diarrhea. Patient vomited oral contrast.  EXAM: CT ABDOMEN AND PELVIS WITH CONTRAST  TECHNIQUE: Multidetector CT imaging of the abdomen and pelvis was performed using the standard protocol following bolus administration of intravenous contrast.  CONTRAST:  25mL OMNIPAQUE IOHEXOL 300 MG/ML SOLN, 100mL OMNIPAQUE IOHEXOL 300 MG/ML SOLN  COMPARISON:  CT 04/01/2013  FINDINGS: Linear atelectasis or scarring in the right middle lobe and lingula.  Peripancreatic inflammatory change about the neck and body of the pancreas, this appears less severe than on prior exam. There is ill-defined peripancreatic fluid at the pancreatic tail, right and left retroperitoneum. Small amount of free fluid seen in the left upper quadrant of the abdomen. There is no frank organized peripancreatic pseudocyst, however right retroperitoneal collection measures 4.5 x 1.7 x 2.9 cm, is complex with questionable peripheral enhancement and may reflect developing organized collection.  Hepatic steatosis again seen. The high-density material in the  gallbladder on prior exam is less well appreciated. Spleen, adrenal glands, and kidneys appear normal. Fluid filled hepatic flexure of the colon may be reactive. Bowel loops are otherwise normal. No obstruction. The abdominal aorta is normal in caliber with mild moderate atherosclerosis.  Within the pelvis the bladder is physiologically distended. There is prominent uterine vascularity. No adnexal mass. No pelvic free fluid or ascites. The appendix is normal.  No acute or suspicious osseous abnormality.  IMPRESSION: 1. Acute pancreatitis. There is fluid in the right and left retroperitoneum and about the tail of the pancreas. On the right, the fluid is complex with mild peripheral enhancement; question early organized collection. 2. Hepatic steatosis.   Electronically Signed   By: Rubye OaksMelanie  Ehinger M.D.   On: 07/08/2014 16:32   I have reviewed current study with Dr. Ulyses SouthwardMark Boles and he was also able to retrieve study from 04/01/2014 which [facility].  Assessment;  Patient is 53 year old Caucasian female who presents with acute pancreatitis. Transaminases are mildly elevated on admission. Abdominopelvic CT revealed changes of acute pancreatitis in 2 fluid collections which appear to be residual from right episode of fulminant pancreatitis that she had in September 2014. Patient has all the hallmarks of biliary pancreatitis. CBD is not dilated and there is no evidence of choledocholithiasis therefore there is no indication for ERCP. Serum triglyceride is elevated but not in the range to cause pancreatitis. Regarding her prior episode of pancreatitis in September 2014 triglyceride level was only 562 well below threshold of 1000. Triglyceridemia needs to be treated in any case. Hematochezia in the setting of diarrhea possibly due to hemorrhoids. Patient has been advised to undergo colonoscopy when she has recovered from acute illness.  Recommendations;  Ceftriaxone 1 g IV every 24 hours. Will ask lab  to check serum triglyceride level on admission blood if possible.. Surgical consultation for cholecystectomy when she is recovered from acute pancreatitis. She will also need intraoperative cholangiogram at the time of surgery. Continue NPO except for ice chips and by mouth meds. Will monitor urine output. For repeat labs in a.m.      LOS: 1 day   REHMAN,NAJEEB U  07/09/2014, 9:12 AM

## 2014-07-09 NOTE — Consult Note (Signed)
Reason for Consult: Pancreatitis, cholelithiasis Referring Physician: Hospitalist, Dr. Ivar Bury Hailey Greer is an 53 y.o. female.  HPI: Patient is a 53 year old white female who earlier this year had hemorrhagic pancreatitis requiring admission to [facility]. Workup at that time was negative for cholelithiasis. They did not have to operate. She presented recently with recurrent episode of epigastric pain. She was found to have pancreatitis. Interestingly, she now has a gallstone. She does have high triglycerides. She is feeling better. CT scan was reviewed. She does not have a pseudocyst of the pancreas.  Past Medical History  Diagnosis Date  . Pancreatitis     Past Surgical History  Procedure Laterality Date  . Tubal ligation      History reviewed. No pertinent family history.  Social History:  reports that she has been smoking.  She does not have any smokeless tobacco history on file. She reports that she does not drink alcohol or use illicit drugs.  Allergies:  Allergies  Allergen Reactions  . Codeine     hives    Medications: I have reviewed the patient's current medications.  Results for orders placed or performed during the hospital encounter of 07/08/14 (from the past 48 hour(s))  CBC with Differential     Status: Abnormal   Collection Time: 07/08/14  1:23 PM  Result Value Ref Range   WBC 24.5 (H) 4.0 - 10.5 K/uL   RBC 4.20 3.87 - 5.11 MIL/uL   Hemoglobin 13.3 12.0 - 15.0 g/dL   HCT 39.9 36.0 - 46.0 %   MCV 95.0 78.0 - 100.0 fL   MCH 31.7 26.0 - 34.0 pg   MCHC 33.3 30.0 - 36.0 g/dL   RDW 13.8 11.5 - 15.5 %   Platelets 402 (H) 150 - 400 K/uL   Neutrophils Relative % 91 (H) 43 - 77 %   Neutro Abs 22.1 (H) 1.7 - 7.7 K/uL   Lymphocytes Relative 7 (L) 12 - 46 %   Lymphs Abs 1.7 0.7 - 4.0 K/uL   Monocytes Relative 2 (L) 3 - 12 %   Monocytes Absolute 0.6 0.1 - 1.0 K/uL   Eosinophils Relative 0 0 - 5 %   Eosinophils Absolute 0.0 0.0 - 0.7 K/uL   Basophils Relative 0  0 - 1 %   Basophils Absolute 0.0 0.0 - 0.1 K/uL  Comprehensive metabolic panel     Status: Abnormal   Collection Time: 07/08/14  1:23 PM  Result Value Ref Range   Sodium 137 137 - 147 mEq/L   Potassium 4.5 3.7 - 5.3 mEq/L   Chloride 96 96 - 112 mEq/L   CO2 23 19 - 32 mEq/L   Glucose, Bld 151 (H) 70 - 99 mg/dL   BUN 6 6 - 23 mg/dL   Creatinine, Ser 0.59 0.50 - 1.10 mg/dL   Calcium 9.7 8.4 - 10.5 mg/dL   Total Protein 7.9 6.0 - 8.3 g/dL   Albumin 4.0 3.5 - 5.2 g/dL   AST 44 (H) 0 - 37 U/L   ALT 61 (H) 0 - 35 U/L   Alkaline Phosphatase 250 (H) 39 - 117 U/L   Total Bilirubin 0.4 0.3 - 1.2 mg/dL   GFR calc non Af Amer >90 >90 mL/min   GFR calc Af Amer >90 >90 mL/min    Comment: (NOTE) The eGFR has been calculated using the CKD EPI equation. This calculation has not been validated in all clinical situations. eGFR's persistently <90 mL/min signify possible Chronic Kidney Disease.  Anion gap 18 (H) 5 - 15  Lipase, blood     Status: Abnormal   Collection Time: 07/08/14  1:23 PM  Result Value Ref Range   Lipase 1082 (H) 11 - 59 U/L  Urinalysis, Routine w reflex microscopic     Status: Abnormal   Collection Time: 07/08/14  1:35 PM  Result Value Ref Range   Color, Urine YELLOW YELLOW   APPearance HAZY (A) CLEAR   Specific Gravity, Urine >1.030 (H) 1.005 - 1.030   pH 6.0 5.0 - 8.0   Glucose, UA NEGATIVE NEGATIVE mg/dL   Hgb urine dipstick SMALL (A) NEGATIVE   Bilirubin Urine SMALL (A) NEGATIVE   Ketones, ur 15 (A) NEGATIVE mg/dL   Protein, ur NEGATIVE NEGATIVE mg/dL   Urobilinogen, UA 0.2 0.0 - 1.0 mg/dL   Nitrite NEGATIVE NEGATIVE   Leukocytes, UA NEGATIVE NEGATIVE  Urine microscopic-add on     Status: Abnormal   Collection Time: 07/08/14  1:35 PM  Result Value Ref Range   Squamous Epithelial / LPF FEW (A) RARE   WBC, UA 0-2 <3 WBC/hpf   RBC / HPF 3-6 <3 RBC/hpf   Bacteria, UA MANY (A) RARE   Casts GRANULAR CAST (A) NEGATIVE  Comprehensive metabolic panel     Status:  Abnormal   Collection Time: 07/09/14  5:40 AM  Result Value Ref Range   Sodium 134 (L) 135 - 145 mmol/L    Comment: Please note change in reference range.   Potassium 3.3 (L) 3.5 - 5.1 mmol/L    Comment: DELTA CHECK NOTED Please note change in reference range.    Chloride 100 96 - 112 mEq/L   CO2 25 19 - 32 mmol/L   Glucose, Bld 122 (H) 70 - 99 mg/dL   BUN <5 (L) 6 - 23 mg/dL   Creatinine, Ser 0.57 0.50 - 1.10 mg/dL   Calcium 9.1 8.4 - 10.5 mg/dL   Total Protein 7.0 6.0 - 8.3 g/dL   Albumin 3.5 3.5 - 5.2 g/dL   AST 27 0 - 37 U/L   ALT 43 (H) 0 - 35 U/L   Alkaline Phosphatase 175 (H) 39 - 117 U/L   Total Bilirubin 0.7 0.3 - 1.2 mg/dL   GFR calc non Af Amer >90 >90 mL/min   GFR calc Af Amer >90 >90 mL/min    Comment: (NOTE) The eGFR has been calculated using the CKD EPI equation. This calculation has not been validated in all clinical situations. eGFR's persistently <90 mL/min signify possible Chronic Kidney Disease.    Anion gap 9 5 - 15  CBC     Status: Abnormal   Collection Time: 07/09/14  5:40 AM  Result Value Ref Range   WBC 20.7 (H) 4.0 - 10.5 K/uL   RBC 4.06 3.87 - 5.11 MIL/uL   Hemoglobin 12.8 12.0 - 15.0 g/dL   HCT 37.9 36.0 - 46.0 %   MCV 93.3 78.0 - 100.0 fL   MCH 31.5 26.0 - 34.0 pg   MCHC 33.8 30.0 - 36.0 g/dL   RDW 13.7 11.5 - 15.5 %   Platelets 364 150 - 400 K/uL  Lipase, blood     Status: Abnormal   Collection Time: 07/09/14  5:40 AM  Result Value Ref Range   Lipase 353 (H) 11 - 59 U/L  Lipid panel     Status: Abnormal   Collection Time: 07/09/14  5:44 AM  Result Value Ref Range   Cholesterol 229 (H) 0 - 200 mg/dL  Triglycerides 592 (H) <150 mg/dL   HDL 64 >39 mg/dL   Total CHOL/HDL Ratio 3.6 RATIO   VLDL UNABLE TO CALCULATE IF TRIGLYCERIDE OVER 400 mg/dL 0 - 40 mg/dL   LDL Cholesterol UNABLE TO CALCULATE IF TRIGLYCERIDE OVER 400 mg/dL 0 - 99 mg/dL    Comment:        Total Cholesterol/HDL:CHD Risk Coronary Heart Disease Risk Table                      Men   Women  1/2 Average Risk   3.4   3.3  Average Risk       5.0   4.4  2 X Average Risk   9.6   7.1  3 X Average Risk  23.4   11.0        Use the calculated Patient Ratio above and the CHD Risk Table to determine the patient's CHD Risk.        ATP III CLASSIFICATION (LDL):  <100     mg/dL   Optimal  100-129  mg/dL   Near or Above                    Optimal  130-159  mg/dL   Borderline  160-189  mg/dL   High  >190     mg/dL   Very High     Ct Abdomen Pelvis W Contrast  07/08/2014   CLINICAL DATA:  Left upper quadrant pain with nausea, vomiting, diarrhea. Patient vomited oral contrast.  EXAM: CT ABDOMEN AND PELVIS WITH CONTRAST  TECHNIQUE: Multidetector CT imaging of the abdomen and pelvis was performed using the standard protocol following bolus administration of intravenous contrast.  CONTRAST:  29mL OMNIPAQUE IOHEXOL 300 MG/ML SOLN, 125mL OMNIPAQUE IOHEXOL 300 MG/ML SOLN  COMPARISON:  CT 04/01/2013  FINDINGS: Linear atelectasis or scarring in the right middle lobe and lingula.  Peripancreatic inflammatory change about the neck and body of the pancreas, this appears less severe than on prior exam. There is ill-defined peripancreatic fluid at the pancreatic tail, right and left retroperitoneum. Small amount of free fluid seen in the left upper quadrant of the abdomen. There is no frank organized peripancreatic pseudocyst, however right retroperitoneal collection measures 4.5 x 1.7 x 2.9 cm, is complex with questionable peripheral enhancement and may reflect developing organized collection.  Hepatic steatosis again seen. The high-density material in the gallbladder on prior exam is less well appreciated. Spleen, adrenal glands, and kidneys appear normal. Fluid filled hepatic flexure of the colon may be reactive. Bowel loops are otherwise normal. No obstruction. The abdominal aorta is normal in caliber with mild moderate atherosclerosis.  Within the pelvis the bladder is physiologically  distended. There is prominent uterine vascularity. No adnexal mass. No pelvic free fluid or ascites. The appendix is normal.  No acute or suspicious osseous abnormality.  IMPRESSION: 1. Acute pancreatitis. There is fluid in the right and left retroperitoneum and about the tail of the pancreas. On the right, the fluid is complex with mild peripheral enhancement; question early organized collection. 2. Hepatic steatosis.   Electronically Signed   By: Jeb Levering M.D.   On: 07/08/2014 16:32   US Abdomen Limited Ruq  07/09/2014   CLINICAL DATA:  Possible pancreatitis, evaluate for common bile duct calculi  EXAM: US ABDOMEN LIMITED - RIGHT UPPER QUADRANT  COMPARISON:  CT abdomen pelvis of 07/08/2014  FINDINGS: Gallbladder:  There is a single non mobile echogenic focus within the gallbladder  of 1.1 cm most consistent with gallstone with shadowing. The gallbladder wall is not thickened and there is no pain over the gallbladder with compression.  Common bile duct:  Diameter: The common bile duct is normal measuring 4.4 mm in diameter. No common bile duct calculi are seen by ultrasound.  Liver:  The liver is somewhat echogenic consistent with fatty infiltration. A small amount of fluid is noted just below the liver which may be related to the patient's recent history of pancreatitis.  Bowel gas does obscure some anatomic detail.  IMPRESSION: 1. At least one gallstone of 1.1 cm. No ultrasound evidence of acute cholecystitis is seen. 2. Probable fatty infiltration of the liver. 3. Small amount of fluid caudal to the liver may be related to the patient's recent history of pancreatitis.   Electronically Signed   By: Ivar Drape M.D.   On: 07/09/2014 10:33    ROS: See chart Blood pressure 142/74, pulse 100, temperature 98.8 F (37.1 C), temperature source Oral, resp. rate 18, height 5' 3.5" (1.613 m), weight 63.05 kg (139 lb), SpO2 98 %. Physical Exam: Pleasant white female in no acute distress. Abdomen is soft  with some epigastric discomfort. No hepatosplenomegaly, masses, hernias, or rigidity are noted.  Assessment/Plan: Impression: Recurrent pancreatitis, now with cholelithiasis Plan: Agree with need for laparoscopic cholecystectomy. This can be done electively once her pancreatitis clinically has improved. She would like to go home for the holidays and I will see her in 1 week in follow-up to schedule elective laparoscopic cholecystectomy.  Dr. Laural Golden agrees with this treatment plan.  Chrysta Fulcher A 07/09/2014, 1:47 PM

## 2014-07-10 DIAGNOSIS — K921 Melena: Secondary | ICD-10-CM

## 2014-07-10 LAB — AST: AST: 19 U/L (ref 0–37)

## 2014-07-10 LAB — BASIC METABOLIC PANEL
Anion gap: 8 (ref 5–15)
CALCIUM: 9 mg/dL (ref 8.4–10.5)
CO2: 26 mmol/L (ref 19–32)
Chloride: 103 mEq/L (ref 96–112)
Creatinine, Ser: 0.55 mg/dL (ref 0.50–1.10)
GFR calc Af Amer: >90 mL/min (ref >90)
GFR calc non Af Amer: >90 mL/min (ref >90)
GLUCOSE: 89 mg/dL (ref 70–99)
Potassium: 3.8 mmol/L (ref 3.5–5.1)
SODIUM: 137 mmol/L (ref 135–145)

## 2014-07-10 LAB — CBC
HEMATOCRIT: 35.1 % — AB (ref 36.0–46.0)
Hemoglobin: 11.5 g/dL — ABNORMAL LOW (ref 12.0–15.0)
MCH: 31.1 pg (ref 26.0–34.0)
MCHC: 32.8 g/dL (ref 30.0–36.0)
MCV: 94.9 fL (ref 78.0–100.0)
PLATELETS: 325 10*3/uL (ref 150–400)
RBC: 3.7 MIL/uL — ABNORMAL LOW (ref 3.87–5.11)
RDW: 14.1 % (ref 11.5–15.5)
WBC: 21.5 10*3/uL — ABNORMAL HIGH (ref 4.0–10.5)

## 2014-07-10 LAB — ALT: ALT: 27 U/L (ref 0–35)

## 2014-07-10 LAB — LIPASE, BLOOD: Lipase: 135 U/L — ABNORMAL HIGH (ref 11–59)

## 2014-07-10 MED ORDER — METOPROLOL TARTRATE 25 MG PO TABS
25.0000 mg | ORAL_TABLET | Freq: Two times a day (BID) | ORAL | Status: DC
  Administered 2014-07-10 – 2014-07-11 (×2): 25 mg via ORAL
  Filled 2014-07-10 (×2): qty 1

## 2014-07-10 NOTE — Progress Notes (Signed)
  Subjective:  Patient states she is feeling better. Pain has decreased. She is requiring pain medication less frequently. She denies nausea or vomiting. She is hungry.    Objective: Blood pressure 154/84, pulse 89, temperature 98.3 F (36.8 C), temperature source Oral, resp. rate 18, height 5' 3.5" (1.613 m), weight 139 lb (63.05 kg), SpO2 98 %. Patient is alert and in no acute distress. Abdomen is symmetrical. Bowel sounds are normal. Abdomen is soft with moderate midepigastric tenderness without guarding. No LE edema or clubbing noted.  Labs/studies Results:   Recent Labs  07/08/14 1323 07/09/14 0540 07/10/14 0538  WBC 24.5* 20.7* 21.5*  HGB 13.3 12.8 11.5*  HCT 39.9 37.9 35.1*  PLT 402* 364 325    BMET   Recent Labs  07/08/14 1323 07/09/14 0540 07/10/14 0538  NA 137 134* 137  K 4.5 3.3* 3.8  CL 96 100 103  CO2 23 25 26   GLUCOSE 151* 122* 89  BUN 6 <5* <5*  CREATININE 0.59 0.57 0.55  CALCIUM 9.7 9.1 9.0    LFT   Recent Labs  07/08/14 1323 07/09/14 0540 07/10/14 0538  PROT 7.9 7.0  --   ALBUMIN 4.0 3.5  --   AST 44* 27 19  ALT 61* 43* 27  ALKPHOS 250* 175*  --   BILITOT 0.4 0.7  --    Serum lipase 135  Lipid panel from 07/09/2014  Cholesterol 229, HDL 64 LDL could not be calculated  Triglyceride 592   Assessment:  #1. Biliary pancreatitis. Patient is gradually improving but remains with leukocytosis and moderate epigastric tenderness. I do not believe he would be a good idea to begin oral feeding today. Patient has been evaluated by Dr. Lovell SheehanJenkins and will undergo cholecystectomy when she has recovered from acute pancreatitis. #2. Hypertriglyceridemia. This needs to be treated aggressively as an outpatient. I do not believe triglyceride level high enough to cause pancreatitis.   Recommendations;  Continue nothing by mouth status today. CBC, metabolic 7 and serum lipase in a.m. Clear liquids starting in a.m.

## 2014-07-10 NOTE — Progress Notes (Signed)
[facility] PROGRESS NOTE  Hailey BusmanCatherine Greer JWJ:[id number] DOB: 03/02/1961 DOA: 07/08/2014 PCP: Selinda FlavinHOWARD, KEVIN, MD  Assessment/Plan: 1. Acute biliary pancreatitis. Ultrasound of the gallbladder indicates evidence of cholelithiasis. She will likely need elective cholecystectomy. She's been seen by general surgery with recommendations for outpatient follow-up. Lipase continued to trend down, leukocytosis is unchanged. Per recommendations from GI, will not plan on starting oral feeding today. Continue nothing by mouth status for today and start clear liquids on morning. Continue supportive treatment with IV fluids and pain management. 2. Hypertension. Start metoprolol twice a day. Continue to monitor 3. Nausea and vomiting. Related to #1. Improving. 4. Leukocytosis. Reactive to #1. Stable. Continue to follow  Code Status: full code Family Communication: discussed with patient and husband at the bedside Disposition Plan: discharge home once improved   Consultants:  Gastroenterology  General Surgery  Procedures:    Antibiotics:    HPI/Subjective: Feels abdominal pain is improving. No nausea or vomiting. Feeling better today  Objective: Filed Vitals:   07/10/14 1604  BP: 163/80  Pulse: 89  Temp:   Resp: 16    Intake/Output Summary (Last 24 hours) at 07/10/14 1945 Last data filed at 07/10/14 1812  Gross per 24 hour  Intake 2393.33 ml  Output    400 ml  Net 1993.33 ml   Filed Weights   07/08/14 1248 07/08/14 1944  Weight: 63.05 kg (139 lb) 63.05 kg (139 lb)    Exam:   General:  NAD  Cardiovascular: S1, S2 RRR  Respiratory: CTA B  Abdomen: soft, mild tenderness in epigastrium, bs+  Musculoskeletal: no edema b/l   Data Reviewed: Basic Metabolic Panel:  Recent Labs Lab 07/08/14 1323 07/09/14 0540 07/10/14 0538  NA 137 134* 137  K 4.5 3.3* 3.8  CL 96 100 103  CO2 23 25 26   GLUCOSE 151* 122* 89  BUN 6 <5* <5*  CREATININE 0.59 0.57 0.55   CALCIUM 9.7 9.1 9.0   Liver Function Tests:  Recent Labs Lab 07/08/14 1323 07/09/14 0540 07/10/14 0538  AST 44* 27 19  ALT 61* 43* 27  ALKPHOS 250* 175*  --   BILITOT 0.4 0.7  --   PROT 7.9 7.0  --   ALBUMIN 4.0 3.5  --     Recent Labs Lab 07/08/14 1323 07/09/14 0540 07/10/14 0538  LIPASE 1082* 353* 135*   No results for input(s): AMMONIA in the last 168 hours. CBC:  Recent Labs Lab 07/08/14 1323 07/09/14 0540 07/10/14 0538  WBC 24.5* 20.7* 21.5*  NEUTROABS 22.1*  --   --   HGB 13.3 12.8 11.5*  HCT 39.9 37.9 35.1*  MCV 95.0 93.3 94.9  PLT 402* 364 325   Cardiac Enzymes: No results for input(s): CKTOTAL, CKMB, CKMBINDEX, TROPONINI in the last 168 hours. BNP (last 3 results) No results for input(s): PROBNP in the last 8760 hours. CBG: No results for input(s): GLUCAP in the last 168 hours.  No results found for this or any previous visit (from the past 240 hour(s)).   Studies: Dg Chest 2 View  07/09/2014   CLINICAL DATA:  Abdominal pain.  Pancreatitis.  EXAM: CHEST  2 VIEW  COMPARISON:  04/01/2013  FINDINGS: The heart size and mediastinal contours are within normal limits. Both lungs are clear. No evidence of pleural effusion. The visualized skeletal structures are unremarkable.  IMPRESSION: No active cardiopulmonary disease.   Electronically Signed   By: Myles RosenthalJohn  Stahl M.D.   On: 07/09/2014 15:45   [place] Abdomen Limited Ruq  07/09/2014   CLINICAL DATA:  Possible pancreatitis, evaluate for common bile duct calculi  EXAM: US ABDOMEN LIMITED - RIGHT UPPER QUADRANT  COMPARISON:  CT abdomen pelvis of 07/08/2014  FINDINGS: Gallbladder:  There is a single non mobile echogenic focus within the gallbladder of 1.1 cm most consistent with gallstone with shadowing. The gallbladder wall is not thickened and there is no pain over the gallbladder with compression.  Common bile duct:  Diameter: The common bile duct is normal measuring 4.4 mm in diameter. No common bile duct calculi  are seen by ultrasound.  Liver:  The liver is somewhat echogenic consistent with fatty infiltration. A small amount of fluid is noted just below the liver which may be related to the patient's recent history of pancreatitis.  Bowel gas does obscure some anatomic detail.  IMPRESSION: 1. At least one gallstone of 1.1 cm. No ultrasound evidence of acute cholecystitis is seen. 2. Probable fatty infiltration of the liver. 3. Small amount of fluid caudal to the liver may be related to the patient's recent history of pancreatitis.   Electronically Signed   By: Dwyane DeePaul  Barry M.D.   On: 07/09/2014 10:33    Scheduled Meds: . cefTRIAXone (ROCEPHIN)  IV  1 g Intravenous Q24H  . heparin  5,000 Units Subcutaneous 3 times per day  . nicotine  21 mg Transdermal Daily  . PARoxetine  40 mg Oral QHS   Continuous Infusions: . 0.9 % NaCl with KCl 40 mEq / L 100 mL/hr (07/09/14 1643)    Active Problems:   Pancreatitis   Hypertension   Nausea & vomiting   Leukocytosis   Pancreatitis, acute    Time spent:2725mins    Vance Hochmuth  [facility] Pager [phone]. If 7PM-7AM, please contact night-coverage at [email or web address], password [email or web address]  [email or web address] 07/10/2014, 7:45 PM  LOS: 2 days

## 2014-07-11 DIAGNOSIS — K859 Acute pancreatitis without necrosis or infection, unspecified: Secondary | ICD-10-CM | POA: Insufficient documentation

## 2014-07-11 LAB — BASIC METABOLIC PANEL
Anion gap: 7 (ref 5–15)
BUN: 5 mg/dL — AB (ref 6–23)
CALCIUM: 8.7 mg/dL (ref 8.4–10.5)
CO2: 24 mmol/L (ref 19–32)
CREATININE: 0.5 mg/dL (ref 0.50–1.10)
Chloride: 107 mEq/L (ref 96–112)
Glucose, Bld: 97 mg/dL (ref 70–99)
POTASSIUM: 3.9 mmol/L (ref 3.5–5.1)
Sodium: 138 mmol/L (ref 135–145)

## 2014-07-11 LAB — CBC
HCT: 31.3 % — ABNORMAL LOW (ref 36.0–46.0)
Hemoglobin: 10.3 g/dL — ABNORMAL LOW (ref 12.0–15.0)
MCH: 31.3 pg (ref 26.0–34.0)
MCHC: 32.9 g/dL (ref 30.0–36.0)
MCV: 95.1 fL (ref 78.0–100.0)
PLATELETS: 303 10*3/uL (ref 150–400)
RBC: 3.29 MIL/uL — AB (ref 3.87–5.11)
RDW: 14.1 % (ref 11.5–15.5)
WBC: 14.2 10*3/uL — ABNORMAL HIGH (ref 4.0–10.5)

## 2014-07-11 LAB — LIPASE, BLOOD: Lipase: 37 U/L (ref 11–59)

## 2014-07-11 MED ORDER — METOPROLOL TARTRATE 25 MG PO TABS: 25.0000 mg | ORAL_TABLET | Freq: Two times a day (BID) | ORAL | Status: DC

## 2014-07-11 MED ORDER — HYDROCODONE-ACETAMINOPHEN 5-325 MG PO TABS: 1.0000 | ORAL_TABLET | Freq: Four times a day (QID) | ORAL | Status: DC | PRN

## 2014-07-11 NOTE — Progress Notes (Signed)
Hailey Busmanatherine Tait discharged home with husband per MD order.  Discharge instructions reviewed and discussed with the patient, all questions and concerns answered. Copy of instructions and scripts given to patient.    Medication List    TAKE these medications        ALPRAZolam 0.5 MG tablet  Commonly known as:  XANAX  Take 0.5 mg by mouth 3 (three) times daily as needed. sleep     atorvastatin 20 MG tablet  Commonly known as:  LIPITOR  Take 20 mg by mouth daily.     HYDROcodone-acetaminophen 5-325 MG per tablet  Commonly known as:  NORCO  Take 1 tablet by mouth every 6 (six) hours as needed for moderate pain.     metoprolol tartrate 25 MG tablet  Commonly known as:  LOPRESSOR  Take 1 tablet (25 mg total) by mouth 2 (two) times daily.     PARoxetine 40 MG tablet  Commonly known as:  PAXIL  Take 40 mg by mouth at bedtime.        Patients skin is clean, dry and intact, no evidence of skin break down. IV site discontinued and catheter remains intact. Site without signs and symptoms of complications. Dressing and pressure applied.  Patient escorted to car by Ladona Ridgelaylor, RN  no distress noted upon discharge.  Ubaldo GlassingJames, Malak Orantes Morgan 07/11/2014 4:03 PM

## 2014-07-11 NOTE — Progress Notes (Signed)
  Subjective:  Patient feels much better. She now has soreness in epigastric region. She denies nausea or vomiting. She had no difficulty with clear liquids. She is passing flatus. She has not taken pain medication and over 12 hours.    Objective: Blood pressure 139/79, pulse 74, temperature 98.1 F (36.7 C), temperature source Oral, resp. rate 16, height 5' 3.5" (1.613 m), weight 139 lb (63.05 kg), SpO2 97 %. Patient is alert and in no acute distress.. Abdomen. Bowel sounds are normal. Abdomen is soft with mild midepigastric tenderness without guarding. No organomegaly or masses. No LE edema or clubbing noted.  Urine output 1000 ML last 24 hours  Labs/studies Results:   Recent Labs  07/09/14 0540 07/10/14 0538 07/11/14 0609  WBC 20.7* 21.5* 14.2*  HGB 12.8 11.5* 10.3*  HCT 37.9 35.1* 31.3*  PLT 364 325 303    BMET   Recent Labs  07/09/14 0540 07/10/14 0538 07/11/14 0609  NA 134* 137 138  K 3.3* 3.8 3.9  CL 100 103 107  CO2 25 26 24   GLUCOSE 122* 89 97  BUN <5* <5* 5*  CREATININE 0.57 0.55 0.50  CALCIUM 9.1 9.0 8.7    LFT   Recent Labs  07/08/14 1323 07/09/14 0540 07/10/14 0538  PROT 7.9 7.0  --   ALBUMIN 4.0 3.5  --   AST 44* 27 19  ALT 61* 43* 27  ALKPHOS 250* 175*  --   BILITOT 0.4 0.7  --      Assessment:  #1. Biliary pancreatitis. Significant improvement in the last 24 hours. Abdominal pain has decreased and so is the tenderness. Serum lipase is normal. White cell count has dropped from over 21,000 to 14000. #2. Hypertriglyceridemia felt not to be a cause of pancreatitis. #3. Cholelithiasis.  Recommendations;  Advance diet to full liquids. IV fluid rate decreased to 50 mL per hour Unless she has meal triggered pain she should be able to go home this afternoon. Patient advised to stay on full liquids rest of this week. Follow-up with Dr. Lovell SheehanJenkins next week as planned. Patient will not need antibiotic when she is discharged.

## 2014-07-11 NOTE — Discharge Summary (Signed)
Physician Discharge Summary  Hailey BusmanCatherine Docter [id number] DOB: 1961-01-15 DOA: 07/08/2014  PCP: Selinda FlavinHOWARD, KEVIN, MD  Admit date: 07/08/2014 Discharge date: 07/11/2014  Time spent: 40 minutes  Recommendations for Outpatient Follow-up:  1. Follow up with general surgery next week to be considered for cholecystecomty  Discharge Diagnoses:  Active Problems:   Acute biliary pancreatitis   Hypertension   Nausea & vomiting   Leukocytosis   Pancreatitis, acute   Acute pancreatitis   Discharge Condition: improved  Diet recommendation: full liquids for the next week until seen by general surgery  Filed Weights   07/08/14 1248 07/08/14 1944  Weight: 63.05 kg (139 lb) 63.05 kg (139 lb)    History of present illness:  This patient was brought to the hospital with abdominal pain, nausea and vomiting. She was found to have acute pancreatitis with serum lipase greater than 1000. CT scan of the abdomen and pelvis showed fluid in the right and left retroperitoneum and about the tail the pancreas. It was felt that fluid on the right was complex with mild peripheral enhancement, question early organized collection. Patient was admitted to the hospital for further treatments.  Hospital Course:  Patient was kept nothing by mouth during her hospital stay. She was treated supportively with IV fluids and pain medications. She was followed by gastroenterology. It was felt the fluid collection seen on CT abdomen with likely old and from previous episode of pancreatitis. Abdominal ultrasound did show possible cholelithiasis. She was seen by general surgery who felt that patient would benefit from cholecystectomy. This procedure will be pursued in the outpatient setting. With supportive measures, the patient's lipase trended down to normal range and her leukocytosis resolved. She was started on a full liquid diet and tolerated this well. Patient is requesting discharge home today and she'll follow up with  general surgery next week for cholecystectomy. She is otherwise stable for discharge.  Patient did have elevated blood pressures during her hospital stay. She was started on low-dose Lopressor with improvement of her blood pressure and heart rate.  Procedures:    Consultations:  Gastroenterology  Gen surgery  Discharge Exam: Filed Vitals:   07/11/14 1309  BP: 123/81  Pulse: 80  Temp: 97.7 F (36.5 C)  Resp: 16    General: NAD Cardiovascular: S1, S2 RRR Respiratory: CTA B  Discharge Instructions   Discharge Instructions    Call MD for:  persistant nausea and vomiting    Complete by:  As directed      Call MD for:  severe uncontrolled pain    Complete by:  As directed      Call MD for:  temperature >100.4    Complete by:  As directed      Diet - low sodium heart healthy    Complete by:  As directed      Increase activity slowly    Complete by:  As directed           Current Discharge Medication List    START taking these medications   Details  HYDROcodone-acetaminophen (NORCO) 5-325 MG per tablet Take 1 tablet by mouth every 6 (six) hours as needed for moderate pain. Qty: 30 tablet, Refills: 0    metoprolol tartrate (LOPRESSOR) 25 MG tablet Take 1 tablet (25 mg total) by mouth 2 (two) times daily. Qty: 60 tablet, Refills: 1      CONTINUE these medications which have NOT CHANGED   Details  ALPRAZolam (XANAX) 0.5 MG tablet Take 0.5 mg by mouth  3 (three) times daily as needed. sleep    atorvastatin (LIPITOR) 20 MG tablet Take 20 mg by mouth daily.    PARoxetine (PAXIL) 40 MG tablet Take 40 mg by mouth at bedtime.        Allergies  Allergen Reactions  . Codeine     hives   Follow-up Information    Follow up with Dalia Heading, MD. Schedule an appointment as soon as possible for a visit on 07/18/2014.   Specialty:  General Surgery   Contact information:   [address] [place] [address] [phone]        The results of  significant diagnostics from this hospitalization (including imaging, microbiology, ancillary and laboratory) are listed below for reference.    Significant Diagnostic Studies: Dg Chest 2 View  07/09/2014   CLINICAL DATA:  Abdominal pain.  Pancreatitis.  EXAM: CHEST  2 VIEW  COMPARISON:  04/01/2013  FINDINGS: The heart size and mediastinal contours are within normal limits. Both lungs are clear. No evidence of pleural effusion. The visualized skeletal structures are unremarkable.  IMPRESSION: No active cardiopulmonary disease.   Electronically Signed   By: Myles Rosenthal M.D.   On: 07/09/2014 15:45   Ct Abdomen Pelvis W Contrast  07/08/2014   CLINICAL DATA:  Left upper quadrant pain with nausea, vomiting, diarrhea. Patient vomited oral contrast.  EXAM: CT ABDOMEN AND PELVIS WITH CONTRAST  TECHNIQUE: Multidetector CT imaging of the abdomen and pelvis was performed using the standard protocol following bolus administration of intravenous contrast.  CONTRAST:  25mL OMNIPAQUE IOHEXOL 300 MG/ML SOLN, OMNIPAQUE IOHEXOL 300 MG/ML SOLN  COMPARISON:  CT 04/01/2013  FINDINGS: Linear atelectasis or scarring in the right middle lobe and lingula.  Peripancreatic inflammatory change about the neck and body of the pancreas, this appears less severe than on prior exam. There is ill-defined peripancreatic fluid at the pancreatic tail, right and left retroperitoneum. Small amount of free fluid seen in the left upper quadrant of the abdomen. There is no frank organized peripancreatic pseudocyst, however right retroperitoneal collection measures 4.5 x 1.7 x 2.9 cm, is complex with questionable peripheral enhancement and may reflect developing organized collection.  Hepatic steatosis again seen. The high-density material in the gallbladder on prior exam is less well appreciated. Spleen, adrenal glands, and kidneys appear normal. Fluid filled hepatic flexure of the colon may be reactive. Bowel loops are otherwise normal. No  obstruction. The abdominal aorta is normal in caliber with mild moderate atherosclerosis.  Within the pelvis the bladder is physiologically distended. There is prominent uterine vascularity. No adnexal mass. No pelvic free fluid or ascites. The appendix is normal.  No acute or suspicious osseous abnormality.  IMPRESSION: 1. Acute pancreatitis. There is fluid in the right and left retroperitoneum and about the tail of the pancreas. On the right, the fluid is complex with mild peripheral enhancement; question early organized collection. 2. Hepatic steatosis.   Electronically Signed   By: Rubye Oaks M.D.   On: 07/08/2014 16:32   US Abdomen Limited Ruq  07/09/2014   CLINICAL DATA:  Possible pancreatitis, evaluate for common bile duct calculi  EXAM: US ABDOMEN LIMITED - RIGHT UPPER QUADRANT  COMPARISON:  CT abdomen pelvis of 07/08/2014  FINDINGS: Gallbladder:  There is a single non mobile echogenic focus within the gallbladder of 1.1 cm most consistent with gallstone with shadowing. The gallbladder wall is not thickened and there is no pain over the gallbladder with compression.  Common bile duct:  Diameter: The  common bile duct is normal measuring 4.4 mm in diameter. No common bile duct calculi are seen by ultrasound.  Liver:  The liver is somewhat echogenic consistent with fatty infiltration. A small amount of fluid is noted just below the liver which may be related to the patient's recent history of pancreatitis.  Bowel gas does obscure some anatomic detail.  IMPRESSION: 1. At least one gallstone of 1.1 cm. No ultrasound evidence of acute cholecystitis is seen. 2. Probable fatty infiltration of the liver. 3. Small amount of fluid caudal to the liver may be related to the patient's recent history of pancreatitis.   Electronically Signed   By: Dwyane DeePaul  Barry M.D.   On: 07/09/2014 10:33    Microbiology: No results found for this or any previous visit (from the past 240 hour(s)).   Labs: Basic Metabolic  Panel:  Recent Labs Lab 07/08/14 1323 07/09/14 0540 07/10/14 0538 07/11/14 0609  NA 137 134* 137 138  K 4.5 3.3* 3.8 3.9  CL 96 100 103 107  CO2 23 25 26 24   GLUCOSE 151* 122* 89 97  BUN 6 <5* <5* 5*  CREATININE 0.59 0.57 0.55 0.50  CALCIUM 9.7 9.1 9.0 8.7   Liver Function Tests:  Recent Labs Lab 07/08/14 1323 07/09/14 0540 07/10/14 0538  AST 44* 27 19  ALT 61* 43* 27  ALKPHOS 250* 175*  --   BILITOT 0.4 0.7  --   PROT 7.9 7.0  --   ALBUMIN 4.0 3.5  --     Recent Labs Lab 07/08/14 1323 07/09/14 0540 07/10/14 0538 07/11/14 0609  LIPASE 1082* 353* 135* 37   No results for input(s): AMMONIA in the last 168 hours. CBC:  Recent Labs Lab 07/08/14 1323 07/09/14 0540 07/10/14 0538 07/11/14 0609  WBC 24.5* 20.7* 21.5* 14.2*  NEUTROABS 22.1*  --   --   --   HGB 13.3 12.8 11.5* 10.3*  HCT 39.9 37.9 35.1* 31.3*  MCV 95.0 93.3 94.9 95.1  PLT 402* 364 325 303   Cardiac Enzymes: No results for input(s): CKTOTAL, CKMB, CKMBINDEX, TROPONINI in the last 168 hours. BNP: BNP (last 3 results) No results for input(s): PROBNP in the last 8760 hours. CBG: No results for input(s): GLUCAP in the last 168 hours.     Signed:  Savita Runner  [facility] 07/11/2014, 2:28 PM

## 2014-07-17 NOTE — H&P (Signed)
  NTS SOAP Note  Vital Signs:  Vitals as of: 07/16/2014: Systolic 121: Diastolic 82: Heart Rate 99: Temp 96.29F: Height 545ft 3.5in: Weight 140Lbs 0 Ounces: BMI 24.41  BMI : 24.41 kg/m2  Subjective: This 53 year old female presents for of gallstone pancreatitis.  Had had multiple admissions to both here and [facility] for pancreatitis.  Found during this admission to have cholelithiasis.  Pancreatitis and abdominal pain have resolved,  thus patient presents for elective laparoscopic cholecystectomy.  Review of Symptoms:  Constitutional:unremarkable   Head:unremarkable Eyes:unremarkable   Nose/Mouth/Throat:sinus problems Cardiovascular:  unremarkable Respiratory:unremarkable Gastrointestinabdominal pain Genitourinary:unremarkable   back pain Skin:unremarkable Hematolgic/Lymphatic:unremarkable   Allergic/Immunologic:unremarkable   Past Medical History:  Reviewed  Past Medical History  Medical Problems: pancreatitis Allergies: codeine Medications: paxil,  lipitor, xanax   Social History:Reviewed  Social History  Preferred Language: English Race:  White Ethnicity: Not Hispanic / Latino Age: [age] year Marital Status:  M Alcohol: daily   Smoking Status: Current every day smoker reviewed on 07/16/2014 Started Date:  Packs per day: 1.00 Functional Status reviewed on 07/16/2014 ------------------------------------------------ Bathing: Normal Cooking: Normal Dressing: Normal Driving: Normal Eating: Normal Managing Meds: Normal Oral Care: Normal Shopping: Normal Toileting: Normal Transferring: Normal Walking: Normal Cognitive Status reviewed on 07/16/2014 ------------------------------------------------ Attention: Normal Decision Making: Normal Language: Normal Memory: Normal Motor: Normal Perception: Normal Problem Solving: Normal Visual and Spatial: Normal   Family History:Reviewed  Family Health History Mother, Healthy;  Father,  Healthy;     Objective Information: General:Well appearing, well nourished in no distress. Neck:Supple without lymphadenopathy.  Heart:RRR, no murmur or gallop.  Normal S1, S2.  No S3, S4.  Lungs:  CTA bilaterally, no wheezes, rhonchi, rales.  Breathing unlabored. Abdomen:Soft, NT/ND, normal bowel sounds, no HSM, no masses.  No peritoneal signs.  Assessment:Gallstone pancreatitis  Diagnoses: 574.20  K80.20 Gallstone (Calculus of gallbladder without cholecystitis without obstruction)  Procedures: [id number] - OFFICE OUTPATIENT NEW 30 MINUTES    Plan:  Scheduled for laparoscopic cholecystectomy on 07/24/14.   Patient Education:Alternative treatments to surgery were discussed with patient (and family).  Risks and benefits  of procedure including bleeding,  infection,  hepatobiliaryh injury,  and the possibility of an open procedure were fully explained to the patient (and family) who gave informed consent. Patient/family questions were addressed.  Follow-up:Pending Surgery

## 2014-07-18 NOTE — Patient Instructions (Addendum)
Hailey BusmanCatherine Greer  07/18/2014   Your procedure is scheduled on:  07/24/13  Report to [facility] HawkingAn[facility] Gre[facility] at 06:15 AM.  Call this number if you have problems the morning of surgery: [phone]   Remember:   Do not eat food or drink liquids after midnight.   Take these medicines the morning of surgery with A SIP OF WATER: Metoprolol, Paxil & you may take your Alprazolam and Hydrocodone if needed.   Do not wear jewelry, make-up or nail polish.  Do not wear lotions, powders, or perfumes. You may wear deodorant.  Do not shave 48 hours prior to surgery. Men may shave face and neck.  Do not bring valuables to the hospital.  [facility] any belongings or valuables.               Contacts, dentures or bridgework may not be worn into surgery.  Leave suitcase in the car. After surgery it may be brought to your room.  For patients admitted to the hospital, discharge time is determined by your treatment team.               Patients discharged the day of surgery will not be allowed to drive home.    Special Instructions: Shower using Hibiclens (CHG bath) the night before surgery and the morning of surgery. Use the one bottle for both showers.    Please read over the following fact sheets that you were given: Anesthesia Post-op Instructions and Care and Recovery After Surgery    Laparoscopic Cholecystectomy Laparoscopic cholecystectomy is surgery to remove the gallbladder. The gallbladder is located in the upper right part of the abdomen, behind the liver. It is a storage sac for bile produced in the liver. Bile aids in the digestion and absorption of fats. Cholecystectomy is often done for inflammation of the gallbladder (cholecystitis). This condition is usually caused by a buildup of gallstones (cholelithiasis) in your gallbladder. Gallstones can block the flow of bile, resulting in inflammation and pain. In severe cases, emergency surgery may be required. When emergency surgery is  not required, you will have time to prepare for the procedure. Laparoscopic surgery is an alternative to open surgery. Laparoscopic surgery has a shorter recovery time. Your common bile duct may also need to be examined during the procedure. If stones are found in the common bile duct, they may be removed. LET [facility] KNOW ABOUT:  Any allergies you have.  All medicines you are taking, including vitamins, herbs, eye drops, creams, and over-the-counter medicines.  Previous problems you or members of your family have had with the use of anesthetics.  Any blood disorders you have.  Previous surgeries you have had.  Medical conditions you have. RISKS AND COMPLICATIONS Generally, this is a safe procedure. However, as with any procedure, complications can occur. Possible complications include:  Infection.  Damage to the common bile duct, nerves, arteries, veins, or other internal organs such as the stomach, liver, or intestines.  Bleeding.  A stone may remain in the common bile duct.  A bile leak from the cyst duct that is clipped when your gallbladder is removed.  The need to convert to open surgery, which requires a larger incision in the abdomen. This may be necessary if your surgeon thinks it is not safe to continue with a laparoscopic procedure. BEFORE THE PROCEDURE  Ask your health care provider about changing or stopping any regular medicines. You will need to stop taking aspirin or blood thinners  at least 5 days prior to surgery.  Do not eat or drink anything after midnight the night before surgery.  Let your health care provider know if you develop a cold or other infectious problem before surgery. PROCEDURE   You will be given medicine to make you sleep through the procedure (general anesthetic). A breathing tube will be placed in your mouth.  When you are asleep, your surgeon will make several small cuts (incisions) in your abdomen.  A thin, lighted  tube with a tiny camera on the end (laparoscope) is inserted through one of the small incisions. The camera on the laparoscope sends a picture to a TV screen in the operating room. This gives the surgeon a good view inside your abdomen.  A gas will be pumped into your abdomen. This expands your abdomen so that the surgeon has more room to perform the surgery.  Other tools needed for the procedure are inserted through the other incisions. The gallbladder is removed through one of the incisions.  After the removal of your gallbladder, the incisions will be closed with stitches, staples, or skin glue. AFTER THE PROCEDURE  You will be taken to a recovery area where your progress will be checked often.  You may be allowed to go home the same day if your pain is controlled and you can tolerate liquids. Document Released: 07/05/2005 Document Revised: 04/25/2013 Document Reviewed: 02/14/2013 [facility] Patient Information 2015 [place]. This information is not intended to replace advice given to you by your health care provider. Make sure you discuss any questions you have with your health care provider.    PATIENT INSTRUCTIONS POST-ANESTHESIA  IMMEDIATELY FOLLOWING SURGERY:  Do not drive or operate machinery for the first twenty four hours after surgery.  Do not make any important decisions for twenty four hours after surgery or while taking narcotic pain medications or sedatives.  If you develop intractable nausea and vomiting or a severe headache please notify your doctor immediately.  FOLLOW-UP:  Please make an appointment with your surgeon as instructed. You do not need to follow up with anesthesia unless specifically instructed to do so.  WOUND CARE INSTRUCTIONS (if applicable):  Keep a dry clean dressing on the anesthesia/puncture wound site if there is drainage.  Once the wound has quit draining you may leave it open to air.  Generally you should leave the bandage intact for twenty four  hours unless there is drainage.  If the epidural site drains for more than 36-48 hours please call the anesthesia department.  QUESTIONS?:  Please feel free to call your physician or the hospital operator if you have any questions, and they will be happy to assist you.

## 2014-07-22 ENCOUNTER — Encounter (HOSPITAL_COMMUNITY): Payer: Self-pay

## 2014-07-22 ENCOUNTER — Encounter (HOSPITAL_COMMUNITY)
Admission: RE | Admit: 2014-07-22 | Discharge: 2014-07-22 | Disposition: A | Discharge status: Home / Self Care | Payer: 59 | Source: Ambulatory Visit | Attending: General Surgery | Admitting: General Surgery

## 2014-07-22 DIAGNOSIS — F329 Major depressive disorder, single episode, unspecified: Secondary | ICD-10-CM | POA: Diagnosis not present

## 2014-07-22 DIAGNOSIS — I10 Essential (primary) hypertension: Secondary | ICD-10-CM | POA: Diagnosis not present

## 2014-07-22 DIAGNOSIS — K8010 Calculus of gallbladder with chronic cholecystitis without obstruction: Secondary | ICD-10-CM | POA: Diagnosis not present

## 2014-07-22 DIAGNOSIS — F17200 Nicotine dependence, unspecified, uncomplicated: Secondary | ICD-10-CM | POA: Diagnosis not present

## 2014-07-22 DIAGNOSIS — Z79899 Other long term (current) drug therapy: Secondary | ICD-10-CM | POA: Diagnosis not present

## 2014-07-22 DIAGNOSIS — K851 Biliary acute pancreatitis: Secondary | ICD-10-CM | POA: Diagnosis present

## 2014-07-22 HISTORY — DX: Essential (primary) hypertension: I10

## 2014-07-22 HISTORY — DX: Major depressive disorder, single episode, unspecified: F32.9

## 2014-07-22 HISTORY — DX: Anxiety disorder, unspecified: F41.9

## 2014-07-22 HISTORY — DX: Depression, unspecified: F32.A

## 2014-07-22 LAB — BASIC METABOLIC PANEL
Anion gap: 7 (ref 5–15)
BUN: 5 mg/dL — ABNORMAL LOW (ref 6–23)
CO2: 27 mmol/L (ref 19–32)
Calcium: 9 mg/dL (ref 8.4–10.5)
Chloride: 101 mEq/L (ref 96–112)
Creatinine, Ser: 0.76 mg/dL (ref 0.50–1.10)
GFR calc Af Amer: >90 mL/min (ref >90)
GFR calc non Af Amer: >90 mL/min (ref >90)
Glucose, Bld: 89 mg/dL (ref 70–99)
Potassium: 4.2 mmol/L (ref 3.5–5.1)
Sodium: 135 mmol/L (ref 135–145)

## 2014-07-22 LAB — CBC WITH DIFFERENTIAL/PLATELET
BASOS ABS: 0.1 10*3/uL (ref 0.0–0.1)
Basophils Relative: 0 % (ref 0–1)
EOS ABS: 0.3 10*3/uL (ref 0.0–0.7)
Eosinophils Relative: 2 % (ref 0–5)
HCT: 33.6 % — ABNORMAL LOW (ref 36.0–46.0)
HEMOGLOBIN: 11 g/dL — AB (ref 12.0–15.0)
LYMPHS PCT: 22 % (ref 12–46)
Lymphs Abs: 3 10*3/uL (ref 0.7–4.0)
MCH: 30.7 pg (ref 26.0–34.0)
MCHC: 32.7 g/dL (ref 30.0–36.0)
MCV: 93.9 fL (ref 78.0–100.0)
Monocytes Absolute: 0.7 10*3/uL (ref 0.1–1.0)
Monocytes Relative: 5 % (ref 3–12)
NEUTROS PCT: 71 % (ref 43–77)
Neutro Abs: 9.5 10*3/uL — ABNORMAL HIGH (ref 1.7–7.7)
Platelets: 638 10*3/uL — ABNORMAL HIGH (ref 150–400)
RBC: 3.58 MIL/uL — ABNORMAL LOW (ref 3.87–5.11)
RDW: 14.3 % (ref 11.5–15.5)
WBC: 13.5 10*3/uL — AB (ref 4.0–10.5)

## 2014-07-22 LAB — HEPATIC FUNCTION PANEL
ALBUMIN: 3.2 g/dL — AB (ref 3.5–5.2)
ALT: 48 U/L — ABNORMAL HIGH (ref 0–35)
AST: 46 U/L — AB (ref 0–37)
Alkaline Phosphatase: 226 U/L — ABNORMAL HIGH (ref 39–117)
BILIRUBIN INDIRECT: 0.3 mg/dL (ref 0.3–0.9)
Bilirubin, Direct: 0.1 mg/dL (ref 0.0–0.3)
TOTAL PROTEIN: 6.7 g/dL (ref 6.0–8.3)
Total Bilirubin: 0.4 mg/dL (ref 0.3–1.2)

## 2014-07-23 NOTE — Progress Notes (Signed)
PLT 638.  Dr Jayme CloudGonzalez made aware of result.  No further orders received.

## 2014-07-24 ENCOUNTER — Ambulatory Visit (HOSPITAL_COMMUNITY): Payer: 59 | Admitting: Anesthesiology

## 2014-07-24 ENCOUNTER — Ambulatory Visit (HOSPITAL_COMMUNITY)
Admission: RE | Admit: 2014-07-24 | Discharge: 2014-07-24 | Disposition: A | Discharge status: Home / Self Care | Payer: 59 | Source: Ambulatory Visit | Attending: General Surgery | Admitting: General Surgery

## 2014-07-24 ENCOUNTER — Encounter (HOSPITAL_COMMUNITY)
Admission: RE | Disposition: A | Discharge status: Home / Self Care | Payer: Self-pay | Source: Ambulatory Visit | Attending: General Surgery

## 2014-07-24 ENCOUNTER — Encounter (HOSPITAL_COMMUNITY): Payer: Self-pay | Admitting: Anesthesiology

## 2014-07-24 DIAGNOSIS — F17200 Nicotine dependence, unspecified, uncomplicated: Secondary | ICD-10-CM | POA: Insufficient documentation

## 2014-07-24 DIAGNOSIS — Z79899 Other long term (current) drug therapy: Secondary | ICD-10-CM | POA: Insufficient documentation

## 2014-07-24 DIAGNOSIS — F329 Major depressive disorder, single episode, unspecified: Secondary | ICD-10-CM | POA: Insufficient documentation

## 2014-07-24 DIAGNOSIS — K851 Biliary acute pancreatitis: Secondary | ICD-10-CM | POA: Insufficient documentation

## 2014-07-24 DIAGNOSIS — I10 Essential (primary) hypertension: Secondary | ICD-10-CM | POA: Insufficient documentation

## 2014-07-24 DIAGNOSIS — K8010 Calculus of gallbladder with chronic cholecystitis without obstruction: Secondary | ICD-10-CM | POA: Diagnosis not present

## 2014-07-24 HISTORY — PX: CHOLECYSTECTOMY: SHX55

## 2014-07-24 SURGERY — LAPAROSCOPIC CHOLECYSTECTOMY
Anesthesia: General

## 2014-07-24 MED ORDER — ROCURONIUM BROMIDE 50 MG/5ML IV SOLN
INTRAVENOUS | Status: AC
  Filled 2014-07-24: qty 1

## 2014-07-24 MED ORDER — LACTATED RINGERS IV SOLN
INTRAVENOUS | Status: DC | PRN
  Administered 2014-07-24: 07:00:00 via INTRAVENOUS

## 2014-07-24 MED ORDER — FENTANYL CITRATE 0.05 MG/ML IJ SOLN: 25.0000 ug | INTRAMUSCULAR | Status: DC | PRN

## 2014-07-24 MED ORDER — LACTATED RINGERS IV SOLN
INTRAVENOUS | Status: DC
  Administered 2014-07-24: 1000 mL via INTRAVENOUS

## 2014-07-24 MED ORDER — GLYCOPYRROLATE 0.2 MG/ML IJ SOLN
INTRAMUSCULAR | Status: DC | PRN
  Administered 2014-07-24: 0.6 mg via INTRAVENOUS

## 2014-07-24 MED ORDER — SODIUM CHLORIDE 0.9 % IR SOLN
Status: DC | PRN
  Administered 2014-07-24: 1000 mL

## 2014-07-24 MED ORDER — ROCURONIUM BROMIDE 100 MG/10ML IV SOLN
INTRAVENOUS | Status: DC | PRN
  Administered 2014-07-24: 5 mg via INTRAVENOUS
  Administered 2014-07-24: 30 mg via INTRAVENOUS

## 2014-07-24 MED ORDER — BUPIVACAINE HCL (PF) 0.5 % IJ SOLN
INTRAMUSCULAR | Status: AC
  Filled 2014-07-24: qty 30

## 2014-07-24 MED ORDER — HEMOSTATIC AGENTS (NO CHARGE) OPTIME
TOPICAL | Status: DC | PRN
  Administered 2014-07-24: 1 via TOPICAL

## 2014-07-24 MED ORDER — MIDAZOLAM HCL 2 MG/2ML IJ SOLN
INTRAMUSCULAR | Status: AC
  Filled 2014-07-24: qty 2

## 2014-07-24 MED ORDER — DEXAMETHASONE SODIUM PHOSPHATE 4 MG/ML IJ SOLN
INTRAMUSCULAR | Status: AC
  Filled 2014-07-24: qty 1

## 2014-07-24 MED ORDER — LIDOCAINE HCL (CARDIAC) 20 MG/ML IV SOLN
INTRAVENOUS | Status: DC | PRN
  Administered 2014-07-24: 50 mg via INTRAVENOUS

## 2014-07-24 MED ORDER — DEXAMETHASONE SODIUM PHOSPHATE 4 MG/ML IJ SOLN
4.0000 mg | Freq: Once | INTRAMUSCULAR | Status: AC
  Administered 2014-07-24: 4 mg via INTRAVENOUS

## 2014-07-24 MED ORDER — LIDOCAINE HCL (PF) 1 % IJ SOLN
INTRAMUSCULAR | Status: AC
  Filled 2014-07-24: qty 5

## 2014-07-24 MED ORDER — KETOROLAC TROMETHAMINE 30 MG/ML IJ SOLN
30.0000 mg | Freq: Once | INTRAMUSCULAR | Status: AC
  Administered 2014-07-24: 30 mg via INTRAVENOUS

## 2014-07-24 MED ORDER — CHLORHEXIDINE GLUCONATE 4 % EX LIQD: 1.0000 "application " | Freq: Once | CUTANEOUS | Status: DC

## 2014-07-24 MED ORDER — BUPIVACAINE HCL (PF) 0.5 % IJ SOLN
INTRAMUSCULAR | Status: DC | PRN
  Administered 2014-07-24: 10 mL

## 2014-07-24 MED ORDER — PROPOFOL 10 MG/ML IV BOLUS
INTRAVENOUS | Status: AC
  Filled 2014-07-24: qty 20

## 2014-07-24 MED ORDER — ONDANSETRON HCL 4 MG/2ML IJ SOLN
INTRAMUSCULAR | Status: AC
  Filled 2014-07-24: qty 2

## 2014-07-24 MED ORDER — POVIDONE-IODINE 10 % EX OINT
TOPICAL_OINTMENT | CUTANEOUS | Status: AC
  Filled 2014-07-24: qty 1

## 2014-07-24 MED ORDER — ONDANSETRON HCL 4 MG/2ML IJ SOLN
4.0000 mg | Freq: Once | INTRAMUSCULAR | Status: AC
  Administered 2014-07-24: 4 mg via INTRAVENOUS

## 2014-07-24 MED ORDER — KETOROLAC TROMETHAMINE 30 MG/ML IJ SOLN
INTRAMUSCULAR | Status: AC
  Filled 2014-07-24: qty 1

## 2014-07-24 MED ORDER — POVIDONE-IODINE 10 % OINT PACKET
TOPICAL_OINTMENT | CUTANEOUS | Status: DC | PRN
  Administered 2014-07-24: 1 via TOPICAL

## 2014-07-24 MED ORDER — ARTIFICIAL TEARS OP OINT
TOPICAL_OINTMENT | OPHTHALMIC | Status: DC | PRN
  Administered 2014-07-24: 1 via OPHTHALMIC

## 2014-07-24 MED ORDER — GLYCOPYRROLATE 0.2 MG/ML IJ SOLN
INTRAMUSCULAR | Status: AC
  Filled 2014-07-24: qty 3

## 2014-07-24 MED ORDER — NEOSTIGMINE METHYLSULFATE 10 MG/10ML IV SOLN
INTRAVENOUS | Status: AC
  Filled 2014-07-24: qty 1

## 2014-07-24 MED ORDER — SODIUM CHLORIDE 0.9 % IJ SOLN
INTRAMUSCULAR | Status: AC
  Filled 2014-07-24: qty 3

## 2014-07-24 MED ORDER — NEOSTIGMINE METHYLSULFATE 10 MG/10ML IV SOLN
INTRAVENOUS | Status: DC | PRN
  Administered 2014-07-24: 4 mg via INTRAVENOUS

## 2014-07-24 MED ORDER — ONDANSETRON HCL 4 MG/2ML IJ SOLN: 4.0000 mg | Freq: Once | INTRAMUSCULAR | Status: DC | PRN

## 2014-07-24 MED ORDER — PROPOFOL 10 MG/ML IV BOLUS
INTRAVENOUS | Status: DC | PRN
  Administered 2014-07-24: 110 mg via INTRAVENOUS

## 2014-07-24 MED ORDER — MIDAZOLAM HCL 2 MG/2ML IJ SOLN
1.0000 mg | INTRAMUSCULAR | Status: DC | PRN
  Administered 2014-07-24: 2 mg via INTRAVENOUS

## 2014-07-24 MED ORDER — OXYCODONE-ACETAMINOPHEN 7.5-325 MG PO TABS: 1.0000 | ORAL_TABLET | ORAL | Status: DC | PRN

## 2014-07-24 MED ORDER — EPHEDRINE SULFATE 50 MG/ML IJ SOLN
INTRAMUSCULAR | Status: AC
  Filled 2014-07-24: qty 1

## 2014-07-24 MED ORDER — SUCCINYLCHOLINE CHLORIDE 20 MG/ML IJ SOLN
INTRAMUSCULAR | Status: AC
  Filled 2014-07-24: qty 1

## 2014-07-24 MED ORDER — CIPROFLOXACIN IN D5W 400 MG/200ML IV SOLN
400.0000 mg | INTRAVENOUS | Status: AC
  Administered 2014-07-24: 400 mg via INTRAVENOUS
  Filled 2014-07-24: qty 200

## 2014-07-24 MED ORDER — FENTANYL CITRATE 0.05 MG/ML IJ SOLN
INTRAMUSCULAR | Status: DC | PRN
  Administered 2014-07-24 (×2): 50 ug via INTRAVENOUS
  Administered 2014-07-24: 100 ug via INTRAVENOUS
  Administered 2014-07-24: 50 ug via INTRAVENOUS

## 2014-07-24 MED ORDER — FENTANYL CITRATE 0.05 MG/ML IJ SOLN
INTRAMUSCULAR | Status: AC
  Filled 2014-07-24: qty 5

## 2014-07-24 MED ORDER — SODIUM CHLORIDE 0.9 % IJ SOLN
INTRAMUSCULAR | Status: AC
  Filled 2014-07-24: qty 10

## 2014-07-24 SURGICAL SUPPLY — 46 items
APPLIER CLIP LAPSCP 10X32 DD (CLIP) ×3 IMPLANT
BAG HAMPER (MISCELLANEOUS) ×3 IMPLANT
BAG SPEC RTRVL LRG 6X4 10 (ENDOMECHANICALS) ×1
BLADE 11 SAFETY STRL DISP (BLADE) IMPLANT
CHLORAPREP W/TINT 26ML (MISCELLANEOUS) ×3 IMPLANT
CLOTH BEACON ORANGE TIMEOUT ST (SAFETY) ×3 IMPLANT
COVER LIGHT HANDLE STERIS (MISCELLANEOUS) ×6 IMPLANT
CUTTER LINEAR ENDO 35 ETS TH (STAPLE) ×3 IMPLANT
DECANTER SPIKE VIAL GLASS SM (MISCELLANEOUS) ×3 IMPLANT
ELECT REM PT RETURN 9FT ADLT (ELECTROSURGICAL) ×3
ELECTRODE REM PT RTRN 9FT ADLT (ELECTROSURGICAL) ×1 IMPLANT
FILTER SMOKE EVAC LAPAROSHD (FILTER) ×3 IMPLANT
FORMALIN 10 PREFIL 120ML (MISCELLANEOUS) ×3 IMPLANT
GLOVE BIOGEL PI IND STRL 7.0 (GLOVE) ×1 IMPLANT
GLOVE BIOGEL PI IND STRL 7.5 (GLOVE) ×2 IMPLANT
GLOVE BIOGEL PI INDICATOR 7.0 (GLOVE) ×2
GLOVE BIOGEL PI INDICATOR 7.5 (GLOVE) ×4
GLOVE ECLIPSE 7.0 STRL STRAW (GLOVE) ×3 IMPLANT
GLOVE SURG SS PI 7.5 STRL IVOR (GLOVE) ×6 IMPLANT
GOWN STRL REUS W/ TWL XL LVL3 (GOWN DISPOSABLE) ×1 IMPLANT
GOWN STRL REUS W/TWL LRG LVL3 (GOWN DISPOSABLE) ×6 IMPLANT
GOWN STRL REUS W/TWL XL LVL3 (GOWN DISPOSABLE) ×2
HEMOSTAT SNOW SURGICEL 2X4 (HEMOSTASIS) ×3 IMPLANT
INST SET LAPROSCOPIC AP (KITS) ×3 IMPLANT
IV NS IRRIG 3000ML ARTHROMATIC (IV SOLUTION) IMPLANT
KIT ROOM TURNOVER APOR (KITS) ×3 IMPLANT
MANIFOLD NEPTUNE II (INSTRUMENTS) ×3 IMPLANT
NEEDLE INSUFFLATION 14GA 120MM (NEEDLE) ×3 IMPLANT
NS IRRIG 1000ML POUR BTL (IV SOLUTION) ×3 IMPLANT
PACK LAP CHOLE LZT030E (CUSTOM PROCEDURE TRAY) ×3 IMPLANT
PAD ARMBOARD 7.5X6 YLW CONV (MISCELLANEOUS) ×3 IMPLANT
POUCH SPECIMEN RETRIEVAL 10MM (ENDOMECHANICALS) ×3 IMPLANT
SET BASIN LINEN APH (SET/KITS/TRAYS/PACK) ×3 IMPLANT
SET TUBE IRRIG SUCTION NO TIP (IRRIGATION / IRRIGATOR) IMPLANT
SLEEVE ENDOPATH XCEL 5M (ENDOMECHANICALS) ×3 IMPLANT
SPONGE GAUZE 2X2 8PLY STER LF (GAUZE/BANDAGES/DRESSINGS) ×4
SPONGE GAUZE 2X2 8PLY STRL LF (GAUZE/BANDAGES/DRESSINGS) ×8 IMPLANT
STAPLER VISISTAT (STAPLE) ×3 IMPLANT
SUT VICRYL 0 UR6 27IN ABS (SUTURE) ×3 IMPLANT
TAPE CLOTH SURG 4X10 WHT LF (GAUZE/BANDAGES/DRESSINGS) ×3 IMPLANT
TROCAR ENDO BLADELESS 11MM (ENDOMECHANICALS) ×3 IMPLANT
TROCAR XCEL NON-BLD 5MMX100MML (ENDOMECHANICALS) ×3 IMPLANT
TROCAR XCEL UNIV SLVE 11M 100M (ENDOMECHANICALS) ×3 IMPLANT
TUBING INSUFFLATION (TUBING) ×3 IMPLANT
WARMER LAPAROSCOPE (MISCELLANEOUS) ×3 IMPLANT
YANKAUER SUCT 12FT TUBE ARGYLE (SUCTIONS) ×3 IMPLANT

## 2014-07-24 NOTE — Discharge Instructions (Signed)

## 2014-07-24 NOTE — Op Note (Signed)
Patient:  Hailey Greer  DOB:  1961-07-11  MRN:  [id number]   Preop Diagnosis:  Gallstone pancreatitis  Postop Diagnosis:  Same  Procedure:  Laparoscopic cholecystectomy  Surgeon:  Franky MachoMark Teairra Millar, M.D.  Anes:  Gen. endotracheal  Indications:  Patient is a 54 year old white female who is had multiple episodes of pancreatitis which is presumed be secondary to biliary sludge and a recently seen gallstone. The risks and benefits of the procedure including bleeding, infection, hepatobiliary injury, the possibility of an open procedure were fully explained to the patient, who gave informed consent.  Procedure note:  Patient was placed the supine position. After induction of general endotracheal anesthesia, the abdomen was prepped and draped using usual sterile technique with ChloraPrep. Surgical site confirmation was performed.  An infraumbilical incision was made down to the fascia. A Veress needle was introduced into the abdominal cavity and confirmation of placement was done using the saline drop test. The abdomen was then insufflated to 16 mmHg pressure. An 11 mm trocar was introduced into the abdominal cavity under direct visualization without difficulty. The patient was placed in reverse Trendelenburg position and additional 11 mm trocar was placed the epigastric region 5 mm trochars were placed the right upper quadrant and right flank regions. Liver was inspected and noted within normal limits. There was a significant amount of scarring around the gallbladder and this was freed away using Bovie electrocautery. The gallbladder was retracted in a dynamic fashion in order to expose the triangle of Calot. The cystic duct was first identified. Its juncture to the infundibulum was fully identified. A vascular Endo GIA was placed across the cystic duct and fired. The cystic artery was likewise ligated and divided using endoclips. The gallbladder was freed away from the gallbladder fossa using Bovie  electrocautery. The gallbladder was delivered through the epigastric trocar site using an Endo Catch bag. The gallbladder fossa was inspected no abnormal bleeding or bile leakage was noted. Surgicel was placed in the gallbladder fossa. All fluid and air were then evacuated from the abdominal cavity prior to removal of the trochars.  All wounds were irrigated with normal saline. All wounds were injected with 0.5% Sensorcaine. The infraumbilical fashion as well as epigastric fascia were reapproximated using 0 Vicryl interrupted sutures. All skin incisions were closed using staples. Betadine ointment and dry sterile dressings were applied.  All tape and needle counts were correct at the end of the procedure. Patient was extubated in the operating room and transferred to PACU in stable condition.  Complications:  None  EBL:  Minimal  Specimen:  Gallbladder

## 2014-07-24 NOTE — Interval H&P Note (Signed)
History and Physical Interval Note:  07/24/2014 7:19 AM  Hailey Busmanatherine Tun  has presented today for surgery, with the diagnosis of cholelithiasis  The various methods of treatment have been discussed with the patient and family. After consideration of risks, benefits and other options for treatment, the patient has consented to  Procedure(s): LAPAROSCOPIC CHOLECYSTECTOMY (N/A) as a surgical intervention .  The patient's history has been reviewed, patient examined, no change in status, stable for surgery.  I have reviewed the patient's chart and labs.  Questions were answered to the patient's satisfaction.     Franky MachoJENKINS,Dvon Jiles A

## 2014-07-24 NOTE — Anesthesia Procedure Notes (Signed)
Procedure Name: Intubation Date/Time: 07/24/2014 7:39 AM Performed by: Pernell DupreADAMS, Hailey Petillo A Pre-anesthesia Checklist: Patient identified, Patient being monitored, Timeout performed, Emergency Drugs available and Suction available Patient Re-evaluated:Patient Re-evaluated prior to inductionOxygen Delivery Method: Circle System Utilized Preoxygenation: Pre-oxygenation with 100% oxygen Intubation Type: IV induction Ventilation: Mask ventilation without difficulty Laryngoscope Size: 3 and Miller Grade View: Grade I Tube type: Oral Tube size: 7.0 mm Number of attempts: 1 Airway Equipment and Method: stylet Placement Confirmation: ETT inserted through vocal cords under direct vision,  positive ETCO2 and breath sounds checked- equal and bilateral Secured at: 21 cm Tube secured with: Tape Dental Injury: Teeth and Oropharynx as per pre-operative assessment

## 2014-07-24 NOTE — Transfer of Care (Signed)
Immediate Anesthesia Transfer of Care Note  Patient: Hailey Greer  Procedure(s) Performed: Procedure(s): LAPAROSCOPIC CHOLECYSTECTOMY (N/A)  Patient Location: PACU  Anesthesia Type:General  Level of Consciousness: sedated and patient cooperative  Airway & Oxygen Therapy: Patient Spontanous Breathing and Patient connected to face mask oxygen  Post-op Assessment: Report given to PACU RN and Post -op Vital signs reviewed and stable  Post vital signs: Reviewed and stable  Complications: No apparent anesthesia complications

## 2014-07-24 NOTE — Anesthesia Postprocedure Evaluation (Signed)
  Anesthesia Post-op Note Late entry Patient: Hailey BusmanCatherine Greer  Procedure(s) Performed: Procedure(s): LAPAROSCOPIC CHOLECYSTECTOMY (N/A)  Patient Location: PACU  Anesthesia Type:General  Level of Consciousness: awake, alert , oriented and patient cooperative  Airway and Oxygen Therapy: Patient Spontanous Breathing  Post-op Pain: none  Post-op Assessment: Post-op Vital signs reviewed, Patient's Cardiovascular Status Stable, Respiratory Function Stable, Patent Airway and No signs of Nausea or vomiting  Post-op Vital Signs: Reviewed and stable  Last Vitals:  Filed Vitals:   07/24/14 0926  BP: 149/90  Pulse: 82  Temp: 36.7 C  Resp:     Complications: No apparent anesthesia complications

## 2014-07-24 NOTE — Anesthesia Preprocedure Evaluation (Signed)
Anesthesia Evaluation  Patient identified by MRN, date of birth, ID band Patient awake    Reviewed: Allergy & Precautions, NPO status , Patient's Chart, lab work & pertinent test results, reviewed documented beta blocker date and time , Unable to perform ROS - Chart review only  Airway Mallampati: II  TM Distance: >3 FB     Dental  (+) Poor Dentition   Pulmonary Current Smoker,  breath sounds clear to auscultation        Cardiovascular hypertension, Pt. on home beta blockers and Pt. on medications Rhythm:Regular Rate:Normal     Neuro/Psych PSYCHIATRIC DISORDERS Anxiety Depression    GI/Hepatic Pancreatitis    Endo/Other    Renal/GU      Musculoskeletal   Abdominal   Peds  Hematology   Anesthesia Other Findings   Reproductive/Obstetrics                             Anesthesia Physical Anesthesia Plan  ASA: II  Anesthesia Plan: General   Post-op Pain Management:    Induction: Intravenous  Airway Management Planned: Oral ETT  Additional Equipment:   Intra-op Plan:   Post-operative Plan: Extubation in OR  Informed Consent: I have reviewed the patients History and Physical, chart, labs and discussed the procedure including the risks, benefits and alternatives for the proposed anesthesia with the patient or authorized representative who has indicated his/her understanding and acceptance.     Plan Discussed with:   Anesthesia Plan Comments:         Anesthesia Quick Evaluation

## 2014-07-25 ENCOUNTER — Encounter (HOSPITAL_COMMUNITY): Payer: Self-pay | Admitting: General Surgery

## 2014-07-25 NOTE — Anesthesia Postprocedure Evaluation (Signed)
  Anesthesia Post-op Note  Patient: Hailey BusmanCatherine Greer  Procedure(s) Performed: Procedure(s): LAPAROSCOPIC CHOLECYSTECTOMY (N/A)  Patient Location: room 3a  Anesthesia Type:General  Level of Consciousness: awake, alert , oriented and patient cooperative  Airway and Oxygen Therapy: Patient Spontanous Breathing  Post-op Pain: 3 /10, mild  Post-op Assessment: Post-op Vital signs reviewed, Patient's Cardiovascular Status Stable, Respiratory Function Stable, Patent Airway, No signs of Nausea or vomiting, Adequate PO intake, Pain level controlled and No headache  Post-op Vital Signs: Reviewed and stable  Last Vitals:  Filed Vitals:   07/24/14 0926  BP: 149/90  Pulse: 82  Temp: 36.7 C  Resp:     Complications: No apparent anesthesia complications

## 2014-07-25 NOTE — Addendum Note (Signed)
Addendum  created 07/25/14 0935 by Despina Hiddenobert J Jeson Camacho, CRNA   Modules edited: Notes Section   Notes Section:  File: [id number]

## 2015-03-27 ENCOUNTER — Emergency Department (HOSPITAL_COMMUNITY)
Admission: EM | Admit: 2015-03-27 | Discharge: 2015-03-27 | Disposition: A | Discharge status: Home / Self Care | Payer: 59 | Attending: Emergency Medicine | Admitting: Emergency Medicine

## 2015-03-27 ENCOUNTER — Encounter (HOSPITAL_COMMUNITY): Payer: Self-pay | Admitting: Emergency Medicine

## 2015-03-27 DIAGNOSIS — R112 Nausea with vomiting, unspecified: Secondary | ICD-10-CM | POA: Insufficient documentation

## 2015-03-27 DIAGNOSIS — K8500 Idiopathic acute pancreatitis without necrosis or infection: Secondary | ICD-10-CM

## 2015-03-27 DIAGNOSIS — I10 Essential (primary) hypertension: Secondary | ICD-10-CM | POA: Insufficient documentation

## 2015-03-27 DIAGNOSIS — R109 Unspecified abdominal pain: Secondary | ICD-10-CM | POA: Diagnosis present

## 2015-03-27 DIAGNOSIS — Z79899 Other long term (current) drug therapy: Secondary | ICD-10-CM | POA: Insufficient documentation

## 2015-03-27 DIAGNOSIS — F329 Major depressive disorder, single episode, unspecified: Secondary | ICD-10-CM | POA: Diagnosis not present

## 2015-03-27 DIAGNOSIS — F419 Anxiety disorder, unspecified: Secondary | ICD-10-CM | POA: Diagnosis not present

## 2015-03-27 DIAGNOSIS — Z720 Tobacco use: Secondary | ICD-10-CM | POA: Diagnosis not present

## 2015-03-27 LAB — COMPREHENSIVE METABOLIC PANEL
ALT: 70 U/L — ABNORMAL HIGH (ref 14–54)
AST: 117 U/L — AB (ref 15–41)
Albumin: 3.3 g/dL — ABNORMAL LOW (ref 3.5–5.0)
Alkaline Phosphatase: 133 U/L — ABNORMAL HIGH (ref 38–126)
Anion gap: 11 (ref 5–15)
BILIRUBIN TOTAL: 0.8 mg/dL (ref 0.3–1.2)
BUN: 12 mg/dL (ref 6–20)
CHLORIDE: 105 mmol/L (ref 101–111)
CO2: 20 mmol/L — AB (ref 22–32)
Calcium: 8.3 mg/dL — ABNORMAL LOW (ref 8.9–10.3)
Creatinine, Ser: 0.83 mg/dL (ref 0.44–1.00)
GFR calc Af Amer: >60 mL/min (ref >60)
GFR calc non Af Amer: >60 mL/min (ref >60)
GLUCOSE: 115 mg/dL — AB (ref 65–99)
Potassium: 4.8 mmol/L (ref 3.5–5.1)
SODIUM: 136 mmol/L (ref 135–145)
TOTAL PROTEIN: 6.3 g/dL — AB (ref 6.5–8.1)

## 2015-03-27 LAB — CBC
HEMATOCRIT: 40.6 % (ref 36.0–46.0)
HEMOGLOBIN: 14.5 g/dL (ref 12.0–15.0)
MCH: 35.4 pg — AB (ref 26.0–34.0)
MCHC: 35.7 g/dL (ref 30.0–36.0)
MCV: 99 fL (ref 78.0–100.0)
Platelets: 331 10*3/uL (ref 150–400)
RBC: 4.1 MIL/uL (ref 3.87–5.11)
RDW: 14.4 % (ref 11.5–15.5)
WBC: 13 10*3/uL — ABNORMAL HIGH (ref 4.0–10.5)

## 2015-03-27 LAB — URINALYSIS, ROUTINE W REFLEX MICROSCOPIC
Bilirubin Urine: NEGATIVE
GLUCOSE, UA: NEGATIVE mg/dL
Hgb urine dipstick: NEGATIVE
Ketones, ur: NEGATIVE mg/dL
Leukocytes, UA: NEGATIVE
Nitrite: NEGATIVE
Protein, ur: NEGATIVE mg/dL
Urobilinogen, UA: 0.2 mg/dL (ref 0.0–1.0)
pH: 6 (ref 5.0–8.0)

## 2015-03-27 LAB — LIPASE, BLOOD: Lipase: 95 U/L — ABNORMAL HIGH (ref 22–51)

## 2015-03-27 MED ORDER — ONDANSETRON 8 MG PO TBDP: 8.0000 mg | ORAL_TABLET | Freq: Three times a day (TID) | ORAL | Status: DC | PRN

## 2015-03-27 MED ORDER — OXYCODONE-ACETAMINOPHEN 5-325 MG PO TABS: 1.0000 | ORAL_TABLET | Freq: Four times a day (QID) | ORAL | Status: DC | PRN

## 2015-03-27 MED ORDER — SODIUM CHLORIDE 0.9 % IV BOLUS (SEPSIS)
1000.0000 mL | Freq: Once | INTRAVENOUS | Status: AC
  Administered 2015-03-27: 1000 mL via INTRAVENOUS

## 2015-03-27 MED ORDER — PROMETHAZINE HCL 25 MG PO TABS: 25.0000 mg | ORAL_TABLET | Freq: Four times a day (QID) | ORAL | Status: DC | PRN

## 2015-03-27 MED ORDER — HYDROMORPHONE HCL 1 MG/ML IJ SOLN
0.5000 mg | Freq: Once | INTRAMUSCULAR | Status: AC
  Administered 2015-03-27: 0.5 mg via INTRAVENOUS
  Filled 2015-03-27: qty 1

## 2015-03-27 MED ORDER — ONDANSETRON HCL 4 MG/2ML IJ SOLN
4.0000 mg | Freq: Once | INTRAMUSCULAR | Status: AC
  Administered 2015-03-27: 4 mg via INTRAVENOUS
  Filled 2015-03-27: qty 2

## 2015-03-27 NOTE — Discharge Instructions (Signed)

## 2015-03-27 NOTE — ED Notes (Signed)
PT c/o midline GI pain with nausea x1 day and reports hx of pancreatitis. PT denies any urinary symptoms and normal BM 03/27/15.

## 2015-03-27 NOTE — ED Notes (Signed)
EDP back in to re eval 

## 2015-03-27 NOTE — ED Provider Notes (Signed)
CSN: [id number]     Arrival date & time 03/27/15  1214 History  This chart was scribed for Benjiman Core, MD by Phillis Haggis, ED Scribe. This patient was seen in room [facility] and patient care was started at 12:54 PM.    Chief Complaint  Patient presents with  . Abdominal Pain   The history is provided by the patient. No language interpreter was used.  HPI Comments: Hailey Greer is a 54 y.o. Female with hx of pancreatitis and HTN who presents to the Emergency Department complaining of midline abdominal tenderness and nausea onset one week ago. Reports associated dry mouth, emesis, with one episode green in color, and loose stools. Reports worsening bilateral epigastric pain with eating. Pt states that she was diagnosed with pancreatitis in September 2014 that may have been caused by gall stones and has not had a flare up that required an ED visit since last year. Pt denies dysuria, other urinary symptoms, fever, or sick contacts. Pt denies drinking alcohol. Reports allergies to codeine.   Past Medical History  Diagnosis Date  . Pancreatitis   . Anxiety   . Depression   . Hypertension    Past Surgical History  Procedure Laterality Date  . Tubal ligation    . Cholecystectomy N/A 07/24/2014    Procedure: LAPAROSCOPIC CHOLECYSTECTOMY;  Surgeon: Dalia Heading, MD;  Location: [facility];  Service: General;  Laterality: N/A;   Family History  Problem Relation Age of Onset  . Cancer Mother    Social History  Substance Use Topics  . Smoking status: Current Every Day Smoker -- 1.00 packs/day for 20 years  . Smokeless tobacco: None  . Alcohol Use: No   OB History    Gravida Para Term Preterm AB TAB SAB Ectopic Multiple Living            1     Review of Systems  Constitutional: Negative for fever.  Gastrointestinal: Positive for nausea, vomiting, abdominal pain and diarrhea.  Genitourinary: Negative for dysuria.  All other systems reviewed and are negative.  Allergies   Codeine  Home Medications   Prior to Admission medications   Medication Sig Start Date End Date Taking? Authorizing Provider  ALPRAZolam Prudy Feeler) 0.5 MG tablet Take 0.5 mg by mouth 3 (three) times daily as needed. sleep   Yes Historical Provider, MD  atorvastatin (LIPITOR) 20 MG tablet Take 20 mg by mouth daily.   Yes Historical Provider, MD  CALCIUM PO Take 1 tablet by mouth daily.   Yes Historical Provider, MD  Multiple Vitamins-Minerals (CENTRUM SILVER PO) Take 1 tablet by mouth daily.   Yes Historical Provider, MD  PARoxetine (PAXIL) 40 MG tablet Take 40 mg by mouth at bedtime.    Yes Historical Provider, MD  ondansetron (ZOFRAN-ODT) 8 MG disintegrating tablet Take 1 tablet (8 mg total) by mouth every 8 (eight) hours as needed for nausea or vomiting. 03/27/15   Benjiman Core, MD  oxyCODONE-acetaminophen (PERCOCET/ROXICET) 5-325 MG per tablet Take 1-2 tablets by mouth every 6 (six) hours as needed. 03/27/15   Benjiman Core, MD  promethazine (PHENERGAN) 25 MG tablet Take 1 tablet (25 mg total) by mouth every 6 (six) hours as needed for nausea. 03/27/15   Benjiman Core, MD   BP 137/107 mmHg  Pulse 108  Temp(Src) 98.1 F (36.7 C) (Oral)  Resp 14  Ht  (1.6 m)  Wt 124 lb (56.246 kg)  BMI 21.97 kg/m2  SpO2 100%  Physical Exam  Constitutional: She is  oriented to person, place, and time. She appears well-developed and well-nourished.  HENT:  Head: Normocephalic and atraumatic.  Mouth/Throat: Oropharynx is clear and moist.  Eyes: EOM are normal. Pupils are equal, round, and reactive to light.  Neck: Normal range of motion. Neck supple.  Cardiovascular: Normal rate, regular rhythm and normal heart sounds.   Pulmonary/Chest: Effort normal and breath sounds normal.  Abdominal: Soft. There is tenderness in the epigastric area.  Mild epigastric tenderness  Musculoskeletal: Normal range of motion.  Neurological: She is alert and oriented to person, place, and time.  Skin: Skin is  warm and dry.  Psychiatric: She has a normal mood and affect. Her behavior is normal.  Nursing note and vitals reviewed.   ED Course  Procedures (including critical care time)  Medications  HYDROmorphone (DILAUDID) injection 0.5 mg (not administered)  sodium chloride 0.9 % bolus 1,000 mL (0 mLs Intravenous Stopped 03/27/15 1400)  ondansetron (ZOFRAN) injection 4 mg (4 mg Intravenous Given 03/27/15 1310)  HYDROmorphone (DILAUDID) injection 0.5 mg (0.5 mg Intravenous Given 03/27/15 1309)    Labs Review Labs Reviewed  LIPASE, BLOOD - Abnormal; Notable for the following:    Lipase 95 (*)    All other components within normal limits  COMPREHENSIVE METABOLIC PANEL - Abnormal; Notable for the following:    CO2 20 (*)    Glucose, Bld 115 (*)    Calcium 8.3 (*)    Total Protein 6.3 (*)    Albumin 3.3 (*)    AST 117 (*)    ALT 70 (*)    Alkaline Phosphatase 133 (*)    All other components within normal limits  CBC - Abnormal; Notable for the following:    WBC 13.0 (*)    MCH 35.4 (*)    All other components within normal limits  URINALYSIS, ROUTINE W REFLEX MICROSCOPIC (NOT AT [facility]) - Abnormal; Notable for the following:    Specific Gravity, Urine <1.005 (*)    All other components within normal limits    Imaging Review No results found.    EKG Interpretation None      MDM   Final diagnoses:  Idiopathic acute pancreatitis  Nausea and vomiting, vomiting of unspecified type    Patient with likely recurrent pancreatitis. Lipase is mildly elevated. Otherwise lab works reassuring. Has tolerated some orals here. Patient is a home and will be discharged with antibiotics and pain medicine. Will follow-up as needed. History of idiopathic pancreatitis that was thought to possibly be caused from hyperlipidemia. I personally performed the services described in this documentation, which was scribed in my presence. The recorded information has been reviewed and is accurate.     Benjiman Core, MD 03/27/15 [phone]

## 2020-01-22 ENCOUNTER — Other Ambulatory Visit (HOSPITAL_COMMUNITY): Payer: Self-pay | Admitting: Family Medicine

## 2020-01-22 DIAGNOSIS — R7989 Other specified abnormal findings of blood chemistry: Secondary | ICD-10-CM

## 2020-01-29 ENCOUNTER — Other Ambulatory Visit: Payer: Self-pay

## 2020-01-29 ENCOUNTER — Ambulatory Visit (HOSPITAL_COMMUNITY)
Admission: RE | Admit: 2020-01-29 | Discharge: 2020-01-29 | Disposition: A | Discharge status: Home / Self Care | Payer: Self-pay | Source: Ambulatory Visit | Attending: Family Medicine | Admitting: Family Medicine

## 2020-01-29 DIAGNOSIS — R7989 Other specified abnormal findings of blood chemistry: Secondary | ICD-10-CM

## 2020-01-29 DIAGNOSIS — R945 Abnormal results of liver function studies: Secondary | ICD-10-CM | POA: Insufficient documentation

## 2020-07-16 ENCOUNTER — Encounter (HOSPITAL_COMMUNITY): Payer: Self-pay | Admitting: Emergency Medicine

## 2020-07-16 ENCOUNTER — Other Ambulatory Visit: Payer: Self-pay

## 2020-07-16 DIAGNOSIS — Y92002 Bathroom of unspecified non-institutional (private) residence single-family (private) house as the place of occurrence of the external cause: Secondary | ICD-10-CM | POA: Insufficient documentation

## 2020-07-16 DIAGNOSIS — I10 Essential (primary) hypertension: Secondary | ICD-10-CM | POA: Insufficient documentation

## 2020-07-16 DIAGNOSIS — Z79899 Other long term (current) drug therapy: Secondary | ICD-10-CM | POA: Insufficient documentation

## 2020-07-16 DIAGNOSIS — Z20822 Contact with and (suspected) exposure to covid-19: Secondary | ICD-10-CM | POA: Insufficient documentation

## 2020-07-16 DIAGNOSIS — F17200 Nicotine dependence, unspecified, uncomplicated: Secondary | ICD-10-CM | POA: Insufficient documentation

## 2020-07-16 DIAGNOSIS — W19XXXA Unspecified fall, initial encounter: Secondary | ICD-10-CM | POA: Insufficient documentation

## 2020-07-16 DIAGNOSIS — Z23 Encounter for immunization: Secondary | ICD-10-CM | POA: Insufficient documentation

## 2020-07-16 DIAGNOSIS — R519 Headache, unspecified: Secondary | ICD-10-CM | POA: Insufficient documentation

## 2020-07-16 DIAGNOSIS — F1010 Alcohol abuse, uncomplicated: Secondary | ICD-10-CM | POA: Insufficient documentation

## 2020-07-16 DIAGNOSIS — R55 Syncope and collapse: Secondary | ICD-10-CM | POA: Insufficient documentation

## 2020-07-16 NOTE — ED Triage Notes (Signed)
Pts husband found her laying in the bathroom floor. Pt unsure if she had a syncopal episode or not. Pt does have laceration to back of head and left black eye.

## 2020-07-17 ENCOUNTER — Emergency Department (HOSPITAL_COMMUNITY)
Admission: EM | Admit: 2020-07-17 | Discharge: 2020-07-17 | Disposition: A | Discharge status: Home / Self Care | Payer: Self-pay | Attending: Emergency Medicine | Admitting: Emergency Medicine

## 2020-07-17 ENCOUNTER — Emergency Department (HOSPITAL_COMMUNITY): Payer: Self-pay

## 2020-07-17 DIAGNOSIS — F1010 Alcohol abuse, uncomplicated: Secondary | ICD-10-CM

## 2020-07-17 DIAGNOSIS — R55 Syncope and collapse: Secondary | ICD-10-CM

## 2020-07-17 LAB — URINALYSIS, ROUTINE W REFLEX MICROSCOPIC
Bilirubin Urine: NEGATIVE
Glucose, UA: NEGATIVE mg/dL
Hgb urine dipstick: NEGATIVE
Ketones, ur: NEGATIVE mg/dL
Leukocytes,Ua: NEGATIVE
Nitrite: NEGATIVE
Protein, ur: NEGATIVE mg/dL
Specific Gravity, Urine: 1.005 (ref 1.005–1.030)
pH: 7 (ref 5.0–8.0)

## 2020-07-17 LAB — CBC WITH DIFFERENTIAL/PLATELET
Abs Immature Granulocytes: 0.08 10*3/uL — ABNORMAL HIGH (ref 0.00–0.07)
Basophils Absolute: 0 10*3/uL (ref 0.0–0.1)
Basophils Relative: 0 %
Eosinophils Absolute: 0 10*3/uL (ref 0.0–0.5)
Eosinophils Relative: 0 %
HCT: 29.9 % — ABNORMAL LOW (ref 36.0–46.0)
Hemoglobin: 10.1 g/dL — ABNORMAL LOW (ref 12.0–15.0)
Immature Granulocytes: 1 %
Lymphocytes Relative: 11 %
Lymphs Abs: 1.4 10*3/uL (ref 0.7–4.0)
MCH: 34.6 pg — ABNORMAL HIGH (ref 26.0–34.0)
MCHC: 33.8 g/dL (ref 30.0–36.0)
MCV: 102.4 fL — ABNORMAL HIGH (ref 80.0–100.0)
Monocytes Absolute: 0.9 10*3/uL (ref 0.1–1.0)
Monocytes Relative: 7 %
Neutro Abs: 10.2 10*3/uL — ABNORMAL HIGH (ref 1.7–7.7)
Neutrophils Relative %: 81 %
Platelets: 153 10*3/uL (ref 150–400)
RBC: 2.92 MIL/uL — ABNORMAL LOW (ref 3.87–5.11)
RDW: 16.4 % — ABNORMAL HIGH (ref 11.5–15.5)
WBC: 12.6 10*3/uL — ABNORMAL HIGH (ref 4.0–10.5)
nRBC: 0 % (ref 0.0–0.2)

## 2020-07-17 LAB — COMPREHENSIVE METABOLIC PANEL
ALT: 54 U/L — ABNORMAL HIGH (ref 0–44)
AST: 149 U/L — ABNORMAL HIGH (ref 15–41)
Albumin: 2.5 g/dL — ABNORMAL LOW (ref 3.5–5.0)
Alkaline Phosphatase: 601 U/L — ABNORMAL HIGH (ref 38–126)
Anion gap: 11 (ref 5–15)
BUN: <5 mg/dL — ABNORMAL LOW (ref 8–23)
CO2: 29 mmol/L (ref 22–32)
Calcium: 8.2 mg/dL — ABNORMAL LOW (ref 8.9–10.3)
Chloride: 95 mmol/L — ABNORMAL LOW (ref 98–111)
Creatinine, Ser: 0.81 mg/dL (ref 0.44–1.00)
GFR, Estimated: >60 mL/min (ref >60)
Glucose, Bld: 174 mg/dL — ABNORMAL HIGH (ref 70–99)
Potassium: 3.1 mmol/L — ABNORMAL LOW (ref 3.5–5.1)
Sodium: 135 mmol/L (ref 135–145)
Total Bilirubin: 2.3 mg/dL — ABNORMAL HIGH (ref 0.3–1.2)
Total Protein: 5.9 g/dL — ABNORMAL LOW (ref 6.5–8.1)

## 2020-07-17 LAB — PROTIME-INR
INR: 1.1 (ref 0.8–1.2)
Prothrombin Time: 13.3 seconds (ref 11.4–15.2)

## 2020-07-17 LAB — RESP PANEL BY RT-PCR (FLU A&B, COVID) ARPGX2
Influenza A by PCR: NEGATIVE
Influenza B by PCR: NEGATIVE
SARS Coronavirus 2 by RT PCR: NEGATIVE

## 2020-07-17 LAB — RAPID URINE DRUG SCREEN, HOSP PERFORMED
Amphetamines: NOT DETECTED
Barbiturates: NOT DETECTED
Benzodiazepines: POSITIVE — AB
Cocaine: NOT DETECTED
Opiates: NOT DETECTED
Tetrahydrocannabinol: NOT DETECTED

## 2020-07-17 LAB — ETHANOL: Alcohol, Ethyl (B): <10 mg/dL (ref <10)

## 2020-07-17 LAB — ACETAMINOPHEN LEVEL: Acetaminophen (Tylenol), Serum: <10 ug/mL — ABNORMAL LOW (ref 10–30)

## 2020-07-17 LAB — AMMONIA: Ammonia: 37 umol/L — ABNORMAL HIGH (ref 9–35)

## 2020-07-17 LAB — SALICYLATE LEVEL: Salicylate Lvl: <7 mg/dL — ABNORMAL LOW (ref 7.0–30.0)

## 2020-07-17 LAB — CK: Total CK: 117 U/L (ref 38–234)

## 2020-07-17 MED ORDER — LORAZEPAM 2 MG/ML IJ SOLN
1.0000 mg | Freq: Once | INTRAMUSCULAR | Status: AC
  Administered 2020-07-17: 04:00:00 1 mg via INTRAVENOUS
  Filled 2020-07-17: qty 1

## 2020-07-17 MED ORDER — TETANUS-DIPHTH-ACELL PERTUSSIS 5-2.5-18.5 LF-MCG/0.5 IM SUSY
0.5000 mL | PREFILLED_SYRINGE | Freq: Once | INTRAMUSCULAR | Status: AC
  Administered 2020-07-17: 03:00:00 0.5 mL via INTRAMUSCULAR
  Filled 2020-07-17: qty 0.5

## 2020-07-17 MED ORDER — LACTATED RINGERS IV BOLUS
1000.0000 mL | Freq: Once | INTRAVENOUS | Status: AC
  Administered 2020-07-17: 04:00:00 1000 mL via INTRAVENOUS

## 2020-07-17 MED ORDER — ALPRAZOLAM 0.5 MG PO TABS: 0.5000 mg | ORAL_TABLET | Freq: Every day | ORAL | 0 refills | Status: DC

## 2020-07-17 NOTE — Discharge Instructions (Addendum)
Substance Abuse Treatment Programs ° °Intensive Outpatient Programs °[facility]     °[address]      °[place], [place]                   °[phone]      ° °[facility] °[address] °[place], [place] °[phone] ° °[facility]     °(Inpatient and outpatient)     °[address]           °[phone]   ° °[facility] °[phone] (Suboxone and Methadone) ° °[address]      °[place], [place] [address]      °[phone]      ° °[address] °[place], [place] °852-3033 ° °[facility] (Outpatient/Inpatient, Chemical)    °(insurance only) [phone]      °       °[facility] (Groups & Residential) °[place], [place] °[phone] ° °   °[facility]     °[address]     °[place], [place]      °[phone]      ° °[facility] (for caregivers and family) °[address] °[place], [place] °336-299-4655 ° ° ° ° ° °Residential Treatment Programs °[facility]      °[address], [place], [place] [address]  °[phone]      ° °[facility] °[address] [place], [place] [address] °[phone] ° °[facility]        °336-248-8914      ° °[facility] °[phone] ° °[facility]             °[address]                                         °[place], [place]                                                °877-615-2722 or 336-784-9470                              ° °[facility] [place] °[address] °[place], [address] °[phone] ° °[facility]    °[address]      °[place], [place]     °[phone]      ° °[facility] °[address] °[place], [place] °336-285-9073 ° °[facility]   °[address]     °[place], [place] [address]     °[phone]      °Admissions: 8am-3pm M-F ° °[facility] °[address] °[place],  [place] °336-227-7417 ° °[facility]: Residential Program (90 Days)   °[place], [place]      °336-725-8389 or [phone]    ° °[facility] °[place], [place] °(Walk in Hours over the weekend or by referral) ° °[facility] °[address], [place],  [address] °([phone] ° °Crisis Mobile: [facility]:  1-877-626-1772 (for crisis response 24 hours a day) °[facility] Hotline:      1-800-256-2452 °Outpatient Psychiatry and Counseling ° °[facility]: Mobile Crisis   Management 24 hours:  1-877-626-1772 ° °[facility] sliding scale fee and walk in schedule: M-F 8am-12pm/1pm-3pm °[address]  °[place], [place] [address] °[phone] ° °[facility] °[address] °[place], [place] [address] °[phone] ° °[facility] (Former[facility] known [facility])- new patient walk-in appointments available Monday - Friday 8am -3pm.          °[address] °[place], [place] [address] °336-676-6840 or crisis line- 336-676-6905 ° °[facility] Intensive Outpatient Therapy Program °[address] °[place], [place] [address] °336-832-9804 ° °[facility]                  °Crisis Services      °[phone]      °[address]     °[place], [place] [address]                ° °[facility]   °[facility] °[phone] °[address] °[place], [place] [address] ° ° °[facility]          °[address],  °[place], [place] [address]       °[phone] ° °[facility] °[address] °[place], [place] [address] °336-292-1510 ° °[facility]    °[address]    °[place], [place] [address]     °[phone]      ° °Parish McKinney, MD     °[address]     °[place] [address]     °336-282-1251     °  °[facility] °[address] °[place] [address] ° °[facility]     °[address]     °[place], [place]      °336-542-2076      ° °[facility] °Shamsher Ahluwalia, MD °[address] °[place], [place] [address] °[phone] ° °[facility]     °[address]     °[place], [place] [address]     °336-274-1237      ° °[facility] °[address] °[place], [place] [address] °336-854-4450 °Resources for Temporary Residential Assistance/Crisis Centers ° °DAY CENTERS °[facility] °M-F 8am-3pm   °[address] [place], [place] [address]   336-332-0824 °Services include: laundry, barbering, support groups, case management, phone  & computer access, showers, AA/NA mtgs, mental health/substance abuse nurse, job skills class, disability information, VA assistance, spiritual classes, etc.  ° °HOMELESS SHELTERS ° °[facility]     °[facility]   °[address], [place]     °[phone]       °       °[facility] (women and children)       °[address] °[place], [place] [address] °336-275-0820 °[email or web address] for application and process °Application Required ° °[facility]   °[address]    °[place] [address]     °[phone]       °             °[facility] °[address] °[place], [place] [address] °336.273.5572 °336-235-0363(schedule application appt.) °Application Required ° °[facility] (women only)    °[address]     °[place],  [address]     °336-884-1039      °  Intake starts 6pm daily °Need valid ID, SSC, & Police report °[facility] [place] °[address] °[place], [place] °336-881-5420 °Application Required ° °[facility] (men only)     °[address]      °[place], [place]     °[phone]      ° °[facility] °(Pregnant women only) °[address] °[place], [place] °336-275-0206 ° °[facility]      °[address]      °[place], [place] [address]     °[phone]      °       °[facility] °[address] °[place], [place] °336-723-1848 °90 day commitment/SA/Application process ° °[facility](men only)     °[address]     °[place], [place]     °[phone]       °Check-in at 7pm     °       °[facility] °[address] °[place], [place] [address] °336-248-6684 °Men/Women/Women and Children must be there by 7 pm ° °[facility] °[place], [place] °336-722-8721                ° °

## 2020-07-17 NOTE — ED Provider Notes (Signed)
[facility] EMERGENCY DEPARTMENT Provider Note   CSN: [id number] Arrival date & time: 07/16/20  2315     History Chief Complaint  Patient presents with  . Fall    Hailey Greer is a 59 y.o. female.  The history is provided by the patient.  Fall This is a new problem. The problem occurs constantly. Associated symptoms include headaches. The symptoms are aggravated by walking. Nothing relieves the symptoms.  Patient with previous history of pancreatitis, hypertension, anxiety presents after multiple falls.  It is reported the patient was found lying in the floor by her husband.  Patient reports she was found sitting on the toilet. She reports recent falls.  She also reports hitting her head multiple times.  She does admit to frequent alcohol use.     Past Medical History:  Diagnosis Date  . Anxiety   . Depression   . Hypertension   . Pancreatitis     Patient Active Problem List   Diagnosis Date Noted  . Acute pancreatitis   . Pancreatitis 07/08/2014  . Hypertension 07/08/2014  . Nausea & vomiting 07/08/2014  . Leukocytosis 07/08/2014  . Pancreatitis, acute 07/08/2014    Past Surgical History:  Procedure Laterality Date  . CHOLECYSTECTOMY N/A 07/24/2014   Procedure: LAPAROSCOPIC CHOLECYSTECTOMY;  Surgeon: Dalia Heading, MD;  Location: [facility];  Service: General;  Laterality: N/A;  . TUBAL LIGATION       OB History    Gravida      Para      Term      Preterm      AB      Living  1     SAB      IAB      Ectopic      Multiple      Live Births              Family History  Problem Relation Age of Onset  . Cancer Mother     Social History   Tobacco Use  . Smoking status: Current Every Day Smoker    Packs/day: 1.00    Years: 20.00    Pack years: 20.00  . Smokeless tobacco: Never Used  Substance Use Topics  . Alcohol use: No  . Drug use: No    Home Medications Prior to Admission medications   Medication Sig Start Date End Date  Taking? Authorizing Provider  ALPRAZolam Prudy Feeler) 0.5 MG tablet Take 0.5 mg by mouth 3 (three) times daily as needed. sleep    [provider]  atorvastatin (LIPITOR) 20 MG tablet Take 20 mg by mouth daily.    [provider]  CALCIUM PO Take 1 tablet by mouth daily.    [provider]  Multiple Vitamins-Minerals (CENTRUM SILVER PO) Take 1 tablet by mouth daily.    [provider]  ondansetron (ZOFRAN-ODT) 8 MG disintegrating tablet Take 1 tablet (8 mg total) by mouth every 8 (eight) hours as needed for nausea or vomiting. 03/27/15   Benjiman Core, MD  oxyCODONE-acetaminophen (PERCOCET/ROXICET) 5-325 MG per tablet Take 1-2 tablets by mouth every 6 (six) hours as needed. 03/27/15   Benjiman Core, MD  PARoxetine (PAXIL) 40 MG tablet Take 40 mg by mouth at bedtime.     [provider]  promethazine (PHENERGAN) 25 MG tablet Take 1 tablet (25 mg total) by mouth every 6 (six) hours as needed for nausea. 03/27/15   Benjiman Core, MD    Allergies    Codeine  Review of Systems   Review of Systems  Constitutional: Positive for fatigue. Negative for fever.  Skin: Positive for wound.  Neurological: Positive for headaches.  All other systems reviewed and are negative.   Physical Exam Updated Vital Signs BP 118/90   Pulse (!) 102   Temp 99.1 F (37.3 C) (Oral)   Resp (!) 26   Ht 1.6 m ( )   Wt 49.9 kg   SpO2 100%   BMI 19.49 kg/m   Physical Exam  CONSTITUTIONAL: Frail and ill-appearing HEAD: Normocephalic, small abrasion to posterior scalp, no signs of trauma EYES: EOMI/PERRL, bruising noted around left eye.  No proptosis. ENMT: Mucous membranes moist, no dental injury, no epistaxis SPINE/BACK: Mild cervical spine tenderness CV: S1/S2 noted, no murmurs/rubs/gallops noted LUNGS: Lungs are clear to auscultation bilaterally, no apparent distress Chest-no bruising or crepitus ABDOMEN: soft, nontender no bruising  GU:no cva  tenderness NEURO: Pt is awake/alert, moves all extremitiesx4.  No facial droop.   Patient appears confused at times during.  Tremor noted EXTREMITIES: pulses normal/equal, full ROM Scattered bruising and abrasions to extremities.  No lacerations.  No deformities. Pelvis is stable.  All other extremities/joints palpated/ranged and nontender SKIN: warm, color normal PSYCH: anxious  ED Results / Procedures / Treatments   Labs (all labs ordered are listed, but only abnormal results are displayed) Labs Reviewed  CBC WITH DIFFERENTIAL/PLATELET - Abnormal; Notable for the following components:      Result Value   WBC 12.6 (*)    RBC 2.92 (*)    Hemoglobin 10.1 (*)    HCT 29.9 (*)    MCV 102.4 (*)    MCH 34.6 (*)    RDW 16.4 (*)    Neutro Abs 10.2 (*)    Abs Immature Granulocytes 0.08 (*)    All other components within normal limits  COMPREHENSIVE METABOLIC PANEL - Abnormal; Notable for the following components:   Potassium 3.1 (*)    Chloride 95 (*)    Glucose, Bld 174 (*)    BUN <5 (*)    Calcium 8.2 (*)    Total Protein 5.9 (*)    Albumin 2.5 (*)    AST 149 (*)    ALT 54 (*)    Alkaline Phosphatase 601 (*)    Total Bilirubin 2.3 (*)    All other components within normal limits  ACETAMINOPHEN LEVEL - Abnormal; Notable for the following components:   Acetaminophen (Tylenol), Serum <10 (*)    All other components within normal limits  AMMONIA - Abnormal; Notable for the following components:   Ammonia 37 (*)    All other components within normal limits  RAPID URINE DRUG SCREEN, HOSP PERFORMED - Abnormal; Notable for the following components:   Benzodiazepines POSITIVE (*)    All other components within normal limits  SALICYLATE LEVEL - Abnormal; Notable for the following components:   Salicylate Lvl <7.0 (*)    All other components within normal limits  RESP PANEL BY RT-PCR (FLU A&B, COVID) ARPGX2  PROTIME-INR  ETHANOL  CK  URINALYSIS, ROUTINE W REFLEX MICROSCOPIC     EKG EKG Interpretation  Date/Time:  Thursday July 17 2020 01:54:57 EST Ventricular Rate:  108 PR Interval:    QRS Duration: 87 QT Interval:  389 QTC Calculation: 522 R Axis:   20 Text Interpretation: Sinus tachycardia Low voltage, precordial leads Probable anteroseptal infarct, old Prolonged QT interval Interpretation limited secondary to artifact Confirmed by Zadie Rhine ([id number]) on 07/17/2020 2:02:49 AM  Radiology CT Head Wo Contrast  Result Date: 07/17/2020 CLINICAL DATA:  Found down EXAM: CT HEAD WITHOUT CONTRAST CT MAXILLOFACIAL WITHOUT CONTRAST CT CERVICAL SPINE WITHOUT CONTRAST TECHNIQUE: Multidetector CT imaging of the head, cervical spine, and maxillofacial structures were performed using the standard protocol without intravenous contrast. Multiplanar CT image reconstructions of the cervical spine and maxillofacial structures were also generated. COMPARISON:  None. FINDINGS: CT HEAD FINDINGS Brain: There is no mass, hemorrhage or extra-axial collection. The size and configuration of the ventricles and extra-axial CSF spaces are normal. There is hypoattenuation of the periventricular white matter, most commonly indicating chronic ischemic microangiopathy. Vascular: No abnormal hyperdensity of the major intracranial arteries or dural venous sinuses. No intracranial atherosclerosis. Skull: The visualized skull base, calvarium and extracranial soft tissues are normal. CT MAXILLOFACIAL FINDINGS Osseous: --Complex facial fracture types: No LeFort, zygomaticomaxillary complex or nasoorbitoethmoidal fracture. --Simple fracture types: None. --Mandible: No fracture or dislocation. Old posttraumatic deformity of the right mandible. Orbits: The globes are intact. Normal appearance of the intra- and extraconal fat. Symmetric extraocular muscles and optic nerves. Left periorbital soft tissue swelling. Sinuses: No fluid levels or advanced mucosal thickening. Soft tissues: Normal visualized  extracranial soft tissues. CT CERVICAL SPINE FINDINGS Alignment: No static subluxation. Facets are aligned. Occipital condyles and the lateral masses of C1-C2 are aligned. Skull base and vertebrae: No acute fracture. Soft tissues and spinal canal: No prevertebral fluid or swelling. No visible canal hematoma. Disc levels: No advanced spinal canal or neural foraminal stenosis. Upper chest: No pneumothorax, pulmonary nodule or pleural effusion. Other: Normal visualized paraspinal cervical soft tissues. IMPRESSION: 1. Chronic ischemic microangiopathy without acute intracranial abnormality. 2. Left periorbital soft tissue swelling without facial or skull fracture. 3. No acute fracture or static subluxation of the cervical spine. Electronically Signed   By: Deatra RobinsonKevin  Herman M.D.   On: 07/17/2020 03:02   CT Cervical Spine Wo Contrast  Result Date: 07/17/2020 CLINICAL DATA:  Found down EXAM: CT HEAD WITHOUT CONTRAST CT MAXILLOFACIAL WITHOUT CONTRAST CT CERVICAL SPINE WITHOUT CONTRAST TECHNIQUE: Multidetector CT imaging of the head, cervical spine, and maxillofacial structures were performed using the standard protocol without intravenous contrast. Multiplanar CT image reconstructions of the cervical spine and maxillofacial structures were also generated. COMPARISON:  None. FINDINGS: CT HEAD FINDINGS Brain: There is no mass, hemorrhage or extra-axial collection. The size and configuration of the ventricles and extra-axial CSF spaces are normal. There is hypoattenuation of the periventricular white matter, most commonly indicating chronic ischemic microangiopathy. Vascular: No abnormal hyperdensity of the major intracranial arteries or dural venous sinuses. No intracranial atherosclerosis. Skull: The visualized skull base, calvarium and extracranial soft tissues are normal. CT MAXILLOFACIAL FINDINGS Osseous: --Complex facial fracture types: No LeFort, zygomaticomaxillary complex or nasoorbitoethmoidal fracture. --Simple  fracture types: None. --Mandible: No fracture or dislocation. Old posttraumatic deformity of the right mandible. Orbits: The globes are intact. Normal appearance of the intra- and extraconal fat. Symmetric extraocular muscles and optic nerves. Left periorbital soft tissue swelling. Sinuses: No fluid levels or advanced mucosal thickening. Soft tissues: Normal visualized extracranial soft tissues. CT CERVICAL SPINE FINDINGS Alignment: No static subluxation. Facets are aligned. Occipital condyles and the lateral masses of C1-C2 are aligned. Skull base and vertebrae: No acute fracture. Soft tissues and spinal canal: No prevertebral fluid or swelling. No visible canal hematoma. Disc levels: No advanced spinal canal or neural foraminal stenosis. Upper chest: No pneumothorax, pulmonary nodule or pleural effusion. Other: Normal visualized paraspinal cervical soft tissues. IMPRESSION: 1. Chronic ischemic microangiopathy without acute  intracranial abnormality. 2. Left periorbital soft tissue swelling without facial or skull fracture. 3. No acute fracture or static subluxation of the cervical spine. Electronically Signed   By: Deatra Robinson M.D.   On: 07/17/2020 03:02   DG Chest Port 1 View  Result Date: 07/17/2020 CLINICAL DATA:  Chest pain, hypertension, syncope EXAM: PORTABLE CHEST 1 VIEW COMPARISON:  07/09/2014 FINDINGS: Lungs are clear. No pneumothorax or pleural effusion. Cardiac size within normal limits. Pulmonary vascularity is normal. Remote fractures of the left fifth and sixth ribs are noted posterolaterally. No acute bone abnormality. IMPRESSION: No active disease. Electronically Signed   By: Helyn Numbers MD   On: 07/17/2020 02:25   CT Maxillofacial Wo Contrast  Result Date: 07/17/2020 CLINICAL DATA:  Found down EXAM: CT HEAD WITHOUT CONTRAST CT MAXILLOFACIAL WITHOUT CONTRAST CT CERVICAL SPINE WITHOUT CONTRAST TECHNIQUE: Multidetector CT imaging of the head, cervical spine, and maxillofacial structures  were performed using the standard protocol without intravenous contrast. Multiplanar CT image reconstructions of the cervical spine and maxillofacial structures were also generated. COMPARISON:  None. FINDINGS: CT HEAD FINDINGS Brain: There is no mass, hemorrhage or extra-axial collection. The size and configuration of the ventricles and extra-axial CSF spaces are normal. There is hypoattenuation of the periventricular white matter, most commonly indicating chronic ischemic microangiopathy. Vascular: No abnormal hyperdensity of the major intracranial arteries or dural venous sinuses. No intracranial atherosclerosis. Skull: The visualized skull base, calvarium and extracranial soft tissues are normal. CT MAXILLOFACIAL FINDINGS Osseous: --Complex facial fracture types: No LeFort, zygomaticomaxillary complex or nasoorbitoethmoidal fracture. --Simple fracture types: None. --Mandible: No fracture or dislocation. Old posttraumatic deformity of the right mandible. Orbits: The globes are intact. Normal appearance of the intra- and extraconal fat. Symmetric extraocular muscles and optic nerves. Left periorbital soft tissue swelling. Sinuses: No fluid levels or advanced mucosal thickening. Soft tissues: Normal visualized extracranial soft tissues. CT CERVICAL SPINE FINDINGS Alignment: No static subluxation. Facets are aligned. Occipital condyles and the lateral masses of C1-C2 are aligned. Skull base and vertebrae: No acute fracture. Soft tissues and spinal canal: No prevertebral fluid or swelling. No visible canal hematoma. Disc levels: No advanced spinal canal or neural foraminal stenosis. Upper chest: No pneumothorax, pulmonary nodule or pleural effusion. Other: Normal visualized paraspinal cervical soft tissues. IMPRESSION: 1. Chronic ischemic microangiopathy without acute intracranial abnormality. 2. Left periorbital soft tissue swelling without facial or skull fracture. 3. No acute fracture or static subluxation of the  cervical spine. Electronically Signed   By: Deatra Robinson M.D.   On: 07/17/2020 03:02    Procedures Procedures    Medications Ordered in ED Medications  Tdap (BOOSTRIX) injection 0.5 mL (0.5 mLs Intramuscular Given 07/17/20 0323)  lactated ringers bolus 1,000 mL (0 mLs Intravenous Stopped 07/17/20 0505)  LORazepam (ATIVAN) injection 1 mg (1 mg Intravenous Given 07/17/20 0424)    ED Course  I have reviewed the triage vital signs and the nursing notes.  Pertinent labs & imaging results that were available during my care of the patient were reviewed by me and considered in my medical decision making (see chart for details).    MDM Rules/Calculators/A&P                          2:07 AM Patient presents after a fall.  It is reported that she was found lying in the floor.  On my exam patient has evidence of bruising throughout her body, and likely is having multiple falls.  She appears mildly confused and tremulous.  She does admit to alcohol use.  Extensive imaging and labs are pending at this time 3:32 AM CT imaging is negative for acute injury Will ambulate patient and reassess 4:17 AM Patient was able to ambulate, but does have orthostatic changes in her heart rate. Will get IV fluids.  Her husband is now in the room.  It is now reported the patient drinks alcohol every day.  She has not had a drink in up to 3 days.  She also ran out of her Xanax.  Patient reports patient has been falling more frequently and he noted that she appeared to have seizure activity today. I suspect patient may have had a withdrawal seizure. Patient adamantly refuses to be admitted to the hospital.  We will give IV fluids, Ativan and reassess  5:43 AM BP 127/81   Pulse 96   Temp 99.1 F (37.3 C) (Oral)   Resp (!) 23   Ht 1.6 m (5\' 3" )   Wt 49.9 kg   SpO2 100%   BMI 19.49 kg/m  Patient stable.  She is awake alert & there is no confusion at this time.  I strongly urged patient that she should be  admitted to the hospital for my concern for alcohol withdrawal seizure.  Patient continues to refuse admission.  This was also discussed with husband who was at bedside.  I discussed risk of death/disability of leaving against medical advice and the patient accepts these risks.  The patient is awake/alert able to make decisions, and does not appear intoxicated Patient discharged against medical advice.    Short course of Xanax given since patient stopped this abruptly and has not had a refill given  Final Clinical Impression(s) / ED Diagnoses Final diagnoses:  Syncope and collapse  Alcohol abuse    Rx / DC Orders ED Discharge Orders         Ordered    ALPRAZolam (XANAX) 0.5 MG tablet  Daily at bedtime        07/17/20 0520           07/19/20, MD 07/17/20 [phone]

## 2020-07-17 NOTE — ED Notes (Signed)
Pt ambulated without assistance

## 2021-02-24 ENCOUNTER — Other Ambulatory Visit: Payer: Self-pay | Admitting: General Practice

## 2021-02-24 ENCOUNTER — Other Ambulatory Visit (HOSPITAL_COMMUNITY): Payer: Self-pay | Admitting: General Practice

## 2021-02-24 DIAGNOSIS — R748 Abnormal levels of other serum enzymes: Secondary | ICD-10-CM

## 2021-03-05 ENCOUNTER — Encounter (HOSPITAL_COMMUNITY): Payer: Self-pay

## 2021-03-05 ENCOUNTER — Ambulatory Visit (HOSPITAL_COMMUNITY): Admission: RE | Admit: 2021-03-05 | Payer: Self-pay | Source: Ambulatory Visit

## 2021-08-28 DIAGNOSIS — Z716 Tobacco abuse counseling: Secondary | ICD-10-CM | POA: Diagnosis not present

## 2021-08-28 DIAGNOSIS — J449 Chronic obstructive pulmonary disease, unspecified: Secondary | ICD-10-CM | POA: Diagnosis not present

## 2021-08-28 DIAGNOSIS — R748 Abnormal levels of other serum enzymes: Secondary | ICD-10-CM | POA: Diagnosis not present

## 2021-08-28 DIAGNOSIS — E7849 Other hyperlipidemia: Secondary | ICD-10-CM | POA: Diagnosis not present

## 2021-08-28 DIAGNOSIS — R69 Illness, unspecified: Secondary | ICD-10-CM | POA: Diagnosis not present

## 2021-08-28 DIAGNOSIS — K909 Intestinal malabsorption, unspecified: Secondary | ICD-10-CM | POA: Diagnosis not present

## 2021-08-28 DIAGNOSIS — R61 Generalized hyperhidrosis: Secondary | ICD-10-CM | POA: Diagnosis not present

## 2021-09-02 ENCOUNTER — Encounter: Payer: Self-pay | Admitting: Internal Medicine

## 2021-10-11 NOTE — Progress Notes (Signed)
? ? ? ?GI Office Note   ? ?Referring Provider: Selinda Flavin, MD ?Primary Care Physician:  Encarnacion Slates, PA-C  ?Primary Gastroenterologist: Roetta Sessions, MD ? ? ?Chief Complaint  ? ?Chief Complaint  ?Patient presents with  ? abnormal stools  ?  Greasy stools. Has frequent bowel movements. Normal in consistency (not loose or diarrhea). Has been going on for past 8 mths. Has been 5 or 6 times today. Grease is not present with every bm, but is present at least once a day.   ? ? ? ?History of Present Illness  ? ?Hailey Greer is a 61 y.o. female presenting today at the request of Dr. Dimas Aguas for further evaluation of greasy stools, bloating, weight loss. She has h/o acute hemorrhagic pancreatitis complicated by acute respiratory failure and acute renal failture in 2014. Ruled out for autoimmune pancreatitis. Triglycerides previously moderately elevated at 770 in 2015. Per records, moderate etoh use at that time.  She had her gallbladder removed in January 2016.  No problems since that time. ? ?H/O etoh use with alcohol level of 135 six months ago while in the ED at [facility]. She presented with intentional overdose on Xanax. She has been seen at [facility].  ? ?She reports her depression is currently doing ok. No longer on any medications except for supplements.  She reports drinking an occasional wine cooler.  In February she told her PCP that she drink a mixed drink every day.  For the past 6 to 8 months, she has noted a change in the color of her stool.  Her stools bright are ranging shiny, appears to have fat in the stool and in the toilet water.  No melena or rectal bleeding.  She reports being checked for celiac disease, she was concerned she may have it because her friend has the disease and has similar symptoms.  She states it was negative.  She denies any abdominal pain.  No nausea or vomiting.  Good appetite.  No heartburn.  Normally does not eat out.  She reports her average normal weight is 125 pounds, she  weighed this about a year ago.  She states she lost down to 90 pounds.  Today she is back to 110 pounds.  The only thing she is tried for her stools is a "blue/green pill".  She is not sure that was very effective.  Currently having a bowel movement about every 4 hours, wakes her up from sleep.  Stools are formed for the most part but can get loose.  ? ?06/2014: 139 pounds ?03/2015: 124 pounds ?06/2020: 110 pounds ?Today: 110 pounds ? ?Medications  ? ?Current Outpatient Medications  ?Medication Sig Dispense Refill  ? CALCIUM PO Take 1 tablet by mouth daily.    ? Multiple Vitamins-Minerals (CENTRUM SILVER PO) Take 1 tablet by mouth daily.    ? ?No current facility-administered medications for this visit.  ? ? ?Allergies  ? ?Allergies as of 10/12/2021 - Review Complete 10/12/2021  ?Allergen Reaction Noted  ? Codeine  07/08/2014  ? ? ?Past Medical History  ? ?Past Medical History:  ?Diagnosis Date  ? Anxiety   ? COPD (chronic obstructive pulmonary disease) (HCC)   ? Depression   ? h/o suicide attempty with xanax in 03/2021  ? H/O ETOH abuse   ? Hypertension   ? Pancreatitis   ? ? ?Past Surgical History  ? ?Past Surgical History:  ?Procedure Laterality Date  ? CHOLECYSTECTOMY N/A 07/24/2014  ? Procedure: LAPAROSCOPIC CHOLECYSTECTOMY;  Surgeon: Loraine Leriche  Val RilesA Jenkins, MD;  Location: [facility];  Service: General;  Laterality: N/A;  ? TUBAL LIGATION    ? ? ?Past Family History  ? ?Family History  ?Problem Relation Age of Onset  ? Cancer Mother   ?     liver  ? Pancreatic cancer Maternal Grandfather   ? Celiac disease Neg Hx   ? Colon cancer Neg Hx   ? Inflammatory bowel disease Neg Hx   ? ? ?Past Social History  ? ?Social History  ? ?Socioeconomic History  ? Marital status: Married  ?  Spouse name: Not on file  ? Number of children: Not on file  ? Years of education: Not on file  ? Highest education level: Not on file  ?Occupational History  ? Not on file  ?Tobacco Use  ? Smoking status: Every Day  ?  Packs/day: 0.50  ?  Years: 20.00  ?   Pack years: 10.00  ?  Types: Cigarettes  ? Smokeless tobacco: Never  ?Substance and Sexual Activity  ? Alcohol use: No  ?  Comment: h/o etoh abuse. per PCP 08/2021 note she was drinking one mixed drink per day.  ? Drug use: No  ? Sexual activity: Yes  ?Other Topics Concern  ? Not on file  ?Social History Narrative  ? Not on file  ? ?Social Determinants of Health  ? ?Financial Resource Strain: Not on file  ?Food Insecurity: Not on file  ?Transportation Needs: Not on file  ?Physical Activity: Not on file  ?Stress: Not on file  ?Social Connections: Not on file  ?Intimate Partner Violence: Not on file  ? ? ?Review of Systems  ? ?General: Negative for anorexia, fever, chills, fatigue, weakness.  See HPI. ?Eyes: Negative for vision changes.  ?ENT: Negative for hoarseness, difficulty swallowing , nasal congestion. ?CV: Negative for chest pain, angina, palpitations, dyspnea on exertion, peripheral edema.  ?Respiratory: Negative for dyspnea at rest, dyspnea on exertion, cough, sputum, wheezing.  ?GI: See history of present illness. ?GU:  Negative for dysuria, hematuria, urinary incontinence, urinary frequency, nocturnal urination.  ?MS: Negative for joint pain, low back pain.  ?Derm: Negative for rash or itching.  ?Neuro: Negative for weakness, abnormal sensation, seizure, frequent headaches, memory loss,  ?confusion.  ?Psych: Negative for suicidal ideation, hallucinations.  Positive for anxiety/depression.  See HPI. ?Endo: See HPI. ?Heme: Negative for bruising or bleeding. ?Allergy: Negative for rash or hives. ? ?Physical Exam  ? ?BP (!) 160/80 (BP Location: Right Arm, Patient Position: Sitting, Cuff Size: Normal)   Pulse 95   Temp (!) 97.5 ?F (36.4 ?C) (Temporal)   Ht 5' 3.5" (1.613 m)   Wt 100 lb 12.8 oz (45.7 kg)   SpO2 100%   BMI 17.58 kg/m?  ?  ?General: Pleasant, thin female in no acute distress.   ?Head: Normocephalic, atraumatic.   ?Eyes: Conjunctiva pink, no icterus. ?Mouth: Oropharyngeal mucosa moist and pink  , no lesions erythema or exudate. ?Neck: Supple without thyromegaly, masses, or lymphadenopathy.  ?Lungs: Clear to auscultation bilaterally.  ?Heart: Regular rate and rhythm, no murmurs rubs or gallops.  ?Abdomen: Bowel sounds are normal, nontender, nondistended, no hepatosplenomegaly or masses,  ?no abdominal bruits or hernia, no rebound or guarding.   ?Rectal: Not performed ?Extremities: No lower extremity edema. No clubbing or deformities.  ?Neuro: Alert and oriented x 4 , grossly normal neurologically.  ?Skin: Warm and dry, no rash or jaundice.   ?Psych: Alert and cooperative, normal mood and affect. ? ?Labs  ? ?  Labs dated 9/20: Hemoglobin 10 hematocrit 27.2, MCV 85.  Platelets 159,000.  Total bilirubin 3.7, alkaline phosphatase 485, AST 185, ALT 102, albumin 2.2, BUN 14, creatinine 0.98.  Alcohol level 135. ? ?Imaging Studies  ? ?No results found. ? ?Assessment  ? ?Pleasant 61 year old female with history of anxiety/depression, COPD, history of alcohol abuse, history of severe hemorrhagic pancreatitis/necrotizing pancreatitis in 2014 with subsequent cholecystectomy in 2016, classified as idiopathic however her triglycerides were modestly elevated in the 700 range and history of moderate alcohol use at that time.  She reports that she did fairly well since 2016 when she had her gallbladder removed.  Presents today for weight loss, bloating, diarrhea. ? ?Diarrhea/weight loss/bloating: Symptoms for 8 to 9 months.  Has a BM about every 4 hours, stools can be solid to loose.  No frank bleeding or melena.  She reports screening for celiac disease but I do not have those records.  No stool studies done.  Complains of nocturnal BMs.  She describes greasy/fatty stools which would suggest malabsorption.  Given prior severe pancreatitis and ongoing alcohol use, she is at risk of chronic pancreatitis and EPI (exocrine pancreatic insufficiency).  There is no recent imaging of her pancreas.  Recommend further  work-up. ? ?Elevated LFTs: Recent admission for intentional Xanax overdose, noted to have alkaline phosphatase in the 400 range, AST 185, ALT 102, total bilirubin 3.7.  Consuming alcohol at the time as well as noted above al

## 2021-10-12 ENCOUNTER — Ambulatory Visit: Payer: 59 | Admitting: Gastroenterology

## 2021-10-12 ENCOUNTER — Other Ambulatory Visit: Payer: Self-pay

## 2021-10-12 ENCOUNTER — Encounter: Payer: Self-pay | Admitting: Gastroenterology

## 2021-10-12 VITALS — BP 160/80 | HR 95 | Temp 97.5°F | Ht 63.5 in | Wt 100.8 lb

## 2021-10-12 DIAGNOSIS — R7989 Other specified abnormal findings of blood chemistry: Secondary | ICD-10-CM

## 2021-10-12 DIAGNOSIS — R197 Diarrhea, unspecified: Secondary | ICD-10-CM

## 2021-10-12 DIAGNOSIS — R634 Abnormal weight loss: Secondary | ICD-10-CM | POA: Diagnosis not present

## 2021-10-12 NOTE — Patient Instructions (Signed)
Please have labs and stool test done at [facility] in [place]. ?We will be in touch with results and further recommendations as they are available. ?

## 2021-10-19 ENCOUNTER — Encounter: Payer: Self-pay | Admitting: Gastroenterology

## 2021-10-22 DIAGNOSIS — R7989 Other specified abnormal findings of blood chemistry: Secondary | ICD-10-CM | POA: Diagnosis not present

## 2021-10-22 DIAGNOSIS — R197 Diarrhea, unspecified: Secondary | ICD-10-CM | POA: Diagnosis not present

## 2021-10-22 DIAGNOSIS — R634 Abnormal weight loss: Secondary | ICD-10-CM | POA: Diagnosis not present

## 2021-10-23 DIAGNOSIS — R7989 Other specified abnormal findings of blood chemistry: Secondary | ICD-10-CM | POA: Diagnosis not present

## 2021-10-23 DIAGNOSIS — R197 Diarrhea, unspecified: Secondary | ICD-10-CM | POA: Diagnosis not present

## 2021-10-23 DIAGNOSIS — R634 Abnormal weight loss: Secondary | ICD-10-CM | POA: Diagnosis not present

## 2021-10-25 LAB — COMPREHENSIVE METABOLIC PANEL
ALT: 32 IU/L (ref 0–32)
AST: 45 IU/L — ABNORMAL HIGH (ref 0–40)
Albumin/Globulin Ratio: 1.5 (ref 1.2–2.2)
Albumin: 4.5 g/dL (ref 3.8–4.8)
Alkaline Phosphatase: 247 IU/L — ABNORMAL HIGH (ref 44–121)
BUN/Creatinine Ratio: 11 — ABNORMAL LOW (ref 12–28)
BUN: 8 mg/dL (ref 8–27)
Bilirubin Total: 0.2 mg/dL (ref 0.0–1.2)
CO2: 23 mmol/L (ref 20–29)
Calcium: 9.4 mg/dL (ref 8.7–10.3)
Chloride: 101 mmol/L (ref 96–106)
Creatinine, Ser: 0.7 mg/dL (ref 0.57–1.00)
Globulin, Total: 3.1 g/dL (ref 1.5–4.5)
Glucose: 119 mg/dL — ABNORMAL HIGH (ref 70–99)
Potassium: 4.3 mmol/L (ref 3.5–5.2)
Sodium: 141 mmol/L (ref 134–144)
Total Protein: 7.6 g/dL (ref 6.0–8.5)
eGFR: 98 mL/min/{1.73_m2} (ref >59)

## 2021-10-25 LAB — CBC WITH DIFFERENTIAL/PLATELET
Basophils Absolute: 0.1 10*3/uL (ref 0.0–0.2)
Basos: 1 %
EOS (ABSOLUTE): 0.2 10*3/uL (ref 0.0–0.4)
Eos: 1 %
Hematocrit: 43.5 % (ref 34.0–46.6)
Hemoglobin: 14 g/dL (ref 11.1–15.9)
Immature Grans (Abs): 0 10*3/uL (ref 0.0–0.1)
Immature Granulocytes: 0 %
Lymphocytes Absolute: 3.5 10*3/uL — ABNORMAL HIGH (ref 0.7–3.1)
Lymphs: 32 %
MCH: 30.4 pg (ref 26.6–33.0)
MCHC: 32.2 g/dL (ref 31.5–35.7)
MCV: 94 fL (ref 79–97)
Monocytes Absolute: 0.9 10*3/uL (ref 0.1–0.9)
Monocytes: 8 %
Neutrophils Absolute: 6.2 10*3/uL (ref 1.4–7.0)
Neutrophils: 58 %
Platelets: 322 10*3/uL (ref 150–450)
RBC: 4.61 x10E6/uL (ref 3.77–5.28)
RDW: 13.6 % (ref 11.7–15.4)
WBC: 10.8 10*3/uL (ref 3.4–10.8)

## 2021-10-25 LAB — ANA: Anti Nuclear Antibody (ANA): NEGATIVE

## 2021-10-25 LAB — IRON,TIBC AND FERRITIN PANEL
Ferritin: 49 ng/mL (ref 15–150)
Iron Saturation: 21 % (ref 15–55)
Iron: 93 ug/dL (ref 27–139)
Total Iron Binding Capacity: 438 ug/dL (ref 250–450)
UIBC: 345 ug/dL (ref 118–369)

## 2021-10-25 LAB — MITOCHONDRIAL/SMOOTH MUSCLE AB PNL
Mitochondrial Ab: <20 Units (ref 0.0–20.0)
Smooth Muscle Ab: 10 Units (ref 0–19)

## 2021-10-25 LAB — TISSUE TRANSGLUTAMINASE, IGA: Transglutaminase IgA: <2 U/mL (ref 0–3)

## 2021-10-25 LAB — HEPATITIS C ANTIBODY: Hep C Virus Ab: NONREACTIVE

## 2021-10-25 LAB — HEPATITIS B SURFACE ANTIGEN: Hepatitis B Surface Ag: NEGATIVE

## 2021-10-25 LAB — C-REACTIVE PROTEIN: CRP: <1 mg/L (ref 0–10)

## 2021-10-25 LAB — IGG, IGA, IGM
IgA/Immunoglobulin A, Serum: 428 mg/dL — ABNORMAL HIGH (ref 87–352)
IgG (Immunoglobin G), Serum: 1350 mg/dL (ref 586–1602)
IgM (Immunoglobulin M), Srm: 171 mg/dL (ref 26–217)

## 2021-10-25 LAB — SEDIMENTATION RATE: Sed Rate: 9 mm/hr (ref 0–40)

## 2021-10-27 LAB — C DIFFICILE, CYTOTOXIN B

## 2021-10-27 LAB — C DIFFICILE TOXINS A+B W/RFLX: C difficile Toxins A+B, EIA: NEGATIVE

## 2021-10-30 ENCOUNTER — Other Ambulatory Visit: Payer: Self-pay | Admitting: Gastroenterology

## 2021-10-30 MED ORDER — PANCRELIPASE (LIP-PROT-AMYL) 36000-114000 UNITS PO CPEP: ORAL_CAPSULE | ORAL | 3 refills | Status: DC

## 2021-11-02 ENCOUNTER — Other Ambulatory Visit: Payer: Self-pay | Admitting: *Deleted

## 2021-11-02 ENCOUNTER — Telehealth: Payer: Self-pay

## 2021-11-02 DIAGNOSIS — R197 Diarrhea, unspecified: Secondary | ICD-10-CM

## 2021-11-02 DIAGNOSIS — R7989 Other specified abnormal findings of blood chemistry: Secondary | ICD-10-CM

## 2021-11-02 DIAGNOSIS — R634 Abnormal weight loss: Secondary | ICD-10-CM

## 2021-11-02 NOTE — Telephone Encounter (Signed)
Patient called requesting the Creon be sent to [facility] in [place] instead of [address].  ?

## 2021-11-03 MED ORDER — PANCRELIPASE (LIP-PROT-AMYL) 36000-114000 UNITS PO CPEP: ORAL_CAPSULE | ORAL | 3 refills | Status: DC

## 2021-11-03 NOTE — Telephone Encounter (Signed)
Done

## 2021-11-03 NOTE — Addendum Note (Signed)
Addended by: Tiffany Kocher on: 11/03/2021 03:11 PM ? ? Modules accepted: Orders ? ?

## 2021-11-04 ENCOUNTER — Telehealth: Payer: Self-pay

## 2021-11-04 NOTE — Telephone Encounter (Signed)
PA for Creon 36000 unit was approved from 4/18//2023 through 11/04/2022. Approval letter will be scanned into patient's chart.  ?

## 2021-11-04 NOTE — Telephone Encounter (Signed)
Pt called back stating that the medication is going to cost $1000 even with her insurance. Pt is wanting to know if there is a cheaper option.  ?

## 2021-11-05 NOTE — Telephone Encounter (Signed)
All of the enzyme options, Creon, generic, Zenpep on my end show preferred level 5, Tier 2 of 4. We might be able to get her patient assistance but would need official testing.  ? ?See if she is willing do complete fecal elastase, stool test. ?

## 2021-11-05 NOTE — Telephone Encounter (Signed)
No ans, unable to leave message.

## 2021-11-09 NOTE — Telephone Encounter (Signed)
Pt states that she will hold off for now. Pt stated that the medication was experimental and that she would hate to go through all of that if the medication ended up not working. Pt is going to proceed with the CT and go from there. Pt doesn't want to have to take medication if it is not necessary.  ?

## 2021-11-09 NOTE — Telephone Encounter (Signed)
Noted  

## 2021-12-07 ENCOUNTER — Ambulatory Visit (HOSPITAL_COMMUNITY): Payer: 59

## 2022-01-12 ENCOUNTER — Ambulatory Visit (HOSPITAL_COMMUNITY)
Admission: RE | Admit: 2022-01-12 | Discharge: 2022-01-12 | Disposition: A | Discharge status: Home / Self Care | Payer: 59 | Source: Ambulatory Visit | Attending: Gastroenterology | Admitting: Gastroenterology

## 2022-01-12 DIAGNOSIS — R7989 Other specified abnormal findings of blood chemistry: Secondary | ICD-10-CM | POA: Diagnosis not present

## 2022-01-12 DIAGNOSIS — R197 Diarrhea, unspecified: Secondary | ICD-10-CM | POA: Insufficient documentation

## 2022-01-12 DIAGNOSIS — K8689 Other specified diseases of pancreas: Secondary | ICD-10-CM | POA: Diagnosis not present

## 2022-01-12 DIAGNOSIS — K861 Other chronic pancreatitis: Secondary | ICD-10-CM | POA: Diagnosis not present

## 2022-01-12 DIAGNOSIS — R634 Abnormal weight loss: Secondary | ICD-10-CM | POA: Insufficient documentation

## 2022-01-12 MED ORDER — IOHEXOL 300 MG/ML  SOLN
100.0000 mL | Freq: Once | INTRAMUSCULAR | Status: AC | PRN
  Administered 2022-01-12: 80 mL via INTRAVENOUS

## 2022-03-05 DIAGNOSIS — R739 Hyperglycemia, unspecified: Secondary | ICD-10-CM | POA: Diagnosis not present

## 2022-03-05 DIAGNOSIS — M25512 Pain in left shoulder: Secondary | ICD-10-CM | POA: Diagnosis not present

## 2022-03-05 DIAGNOSIS — R69 Illness, unspecified: Secondary | ICD-10-CM | POA: Diagnosis not present

## 2022-03-05 DIAGNOSIS — K861 Other chronic pancreatitis: Secondary | ICD-10-CM | POA: Diagnosis not present

## 2022-03-05 DIAGNOSIS — Z716 Tobacco abuse counseling: Secondary | ICD-10-CM | POA: Diagnosis not present

## 2022-03-05 DIAGNOSIS — K909 Intestinal malabsorption, unspecified: Secondary | ICD-10-CM | POA: Diagnosis not present

## 2022-03-05 DIAGNOSIS — E7849 Other hyperlipidemia: Secondary | ICD-10-CM | POA: Diagnosis not present

## 2022-03-05 DIAGNOSIS — B070 Plantar wart: Secondary | ICD-10-CM | POA: Diagnosis not present

## 2022-03-05 DIAGNOSIS — K7460 Unspecified cirrhosis of liver: Secondary | ICD-10-CM | POA: Diagnosis not present

## 2022-03-05 DIAGNOSIS — J449 Chronic obstructive pulmonary disease, unspecified: Secondary | ICD-10-CM | POA: Diagnosis not present

## 2022-03-05 DIAGNOSIS — R030 Elevated blood-pressure reading, without diagnosis of hypertension: Secondary | ICD-10-CM | POA: Diagnosis not present

## 2022-03-05 DIAGNOSIS — R748 Abnormal levels of other serum enzymes: Secondary | ICD-10-CM | POA: Diagnosis not present

## 2022-03-15 NOTE — Progress Notes (Unsigned)
GI Office Note    Referring Provider: Joeseph Amor Primary Care Physician:  Joeseph Amor  Primary Gastroenterologist: Roetta Sessions, MD   Chief Complaint   No chief complaint on file.   History of Present Illness   Atiyana Greer is a 61 y.o. female presenting today for follow-up.  Last seen in March 2023.  History of weight loss, greasy stools.  History of acute hemorrhagic pancreatitis complicated by acute respiratory failure and acute renal failure back in 2014.  Triglycerides previously moderately elevated at 770 in 2015.  Per records, moderate alcohol use at that time.  Gallbladder removed in 2016.  No problems since that time.  Autoimmune pancreatitis work-up negative.  Documented elevated alcohol level last fall while in the ED at [facility].  She presented with intentional overdose on Xanax at that time.  Elevated LFTs at that time.  Labs from April 2023, celiac screen negative.  Hepatitis B and C markers negative.  No iron overload.  LFTs better, alkaline phosphatase still abnormal at 247 but better, AST 45.   CT abdomen pelvis with contrast June 2023 with findings of chronic pancreatitis, findings suspicious for hepatic cirrhosis and possible portal venous hypertension.  Mitochondrial antibodies negative, ANA negative, globulins unremarkable.  C. difficile negative.  We recommended her complete a fecal elastase versus trial.  Again sounds.  She wanted to hold off medication stating it was "experimental".    Medications   Current Outpatient Medications  Medication Sig Dispense Refill   CALCIUM PO Take 1 tablet by mouth daily.     lipase/protease/amylase (CREON) 36000 UNITS CPEP capsule Take 1-2 capsules at onset of each meal, and 1 capsule at onset of each snack. No more than 8 per day. 240 capsule 3   Multiple Vitamins-Minerals (CENTRUM SILVER PO) Take 1 tablet by mouth daily.     No current facility-administered medications for this visit.     Allergies   Allergies as of 03/16/2022 - Review Complete 01/12/2022  Allergen Reaction Noted   Codeine  07/08/2014     Past Medical History   Past Medical History:  Diagnosis Date   Anxiety    COPD (chronic obstructive pulmonary disease) (HCC)    Depression    h/o suicide attempty with xanax in 03/2021   H/O ETOH abuse    Hypertension    Pancreatitis     Past Surgical History   Past Surgical History:  Procedure Laterality Date   CHOLECYSTECTOMY N/A 07/24/2014   Procedure: LAPAROSCOPIC CHOLECYSTECTOMY;  Surgeon: Dalia Heading, MD;  Location: [facility];  Service: General;  Laterality: N/A;   TUBAL LIGATION      Past Family History   Family History  Problem Relation Age of Onset   Cancer Mother        liver   Pancreatic cancer Maternal Grandfather    Celiac disease Neg Hx    Colon cancer Neg Hx    Inflammatory bowel disease Neg Hx     Past Social History   Social History   Socioeconomic History   Marital status: Married    Spouse name: Not on file   Number of children: Not on file   Years of education: Not on file   Highest education level: Not on file  Occupational History   Not on file  Tobacco Use   Smoking status: Every Day    Packs/day: 0.50    Years: 20.00    Total pack years: 10.00  Types: Cigarettes   Smokeless tobacco: Never  Substance and Sexual Activity   Alcohol use: No    Comment: h/o etoh abuse. per PCP 08/2021 note she was drinking one mixed drink per day.   Drug use: No   Sexual activity: Yes  Other Topics Concern   Not on file  Social History Narrative   Not on file   Social Determinants of Health   Financial Resource Strain: Not on file  Food Insecurity: Not on file  Transportation Needs: Not on file  Physical Activity: Not on file  Stress: Not on file  Social Connections: Not on file  Intimate Partner Violence: Not on file    Review of Systems   General: Negative for anorexia, weight loss, fever, chills, fatigue,  weakness. ENT: Negative for hoarseness, difficulty swallowing , nasal congestion. CV: Negative for chest pain, angina, palpitations, dyspnea on exertion, peripheral edema.  Respiratory: Negative for dyspnea at rest, dyspnea on exertion, cough, sputum, wheezing.  GI: See history of present illness. GU:  Negative for dysuria, hematuria, urinary incontinence, urinary frequency, nocturnal urination.  Endo: Negative for unusual weight change.     Physical Exam   There were no vitals taken for this visit.   General: Well-nourished, well-developed in no acute distress.  Eyes: No icterus. Mouth: Oropharyngeal mucosa moist and pink , no lesions erythema or exudate. Lungs: Clear to auscultation bilaterally.  Heart: Regular rate and rhythm, no murmurs rubs or gallops.  Abdomen: Bowel sounds are normal, nontender, nondistended, no hepatosplenomegaly or masses,  no abdominal bruits or hernia , no rebound or guarding.  Rectal: ***  Extremities: No lower extremity edema. No clubbing or deformities. Neuro: Alert and oriented x 4   Skin: Warm and dry, no jaundice.   Psych: Alert and cooperative, normal mood and affect.  Labs   *** Imaging Studies   No results found.  Assessment       PLAN   ***   [name]. Melvyn Neth, MHS, PA-C [facility]

## 2022-03-16 ENCOUNTER — Ambulatory Visit: Payer: 59 | Admitting: Gastroenterology

## 2022-03-16 ENCOUNTER — Encounter: Payer: Self-pay | Admitting: Gastroenterology

## 2022-03-16 ENCOUNTER — Telehealth: Payer: Self-pay

## 2022-03-16 VITALS — BP 130/82 | HR 86 | Temp 98.0°F | Ht 63.5 in | Wt 109.0 lb

## 2022-03-16 DIAGNOSIS — K861 Other chronic pancreatitis: Secondary | ICD-10-CM

## 2022-03-16 DIAGNOSIS — K909 Intestinal malabsorption, unspecified: Secondary | ICD-10-CM | POA: Diagnosis not present

## 2022-03-16 DIAGNOSIS — R197 Diarrhea, unspecified: Secondary | ICD-10-CM

## 2022-03-16 DIAGNOSIS — K7460 Unspecified cirrhosis of liver: Secondary | ICD-10-CM

## 2022-03-16 MED ORDER — ZENPEP 40000-126000 UNITS PO CPEP: ORAL_CAPSULE | ORAL | 5 refills | Status: DC

## 2022-03-16 NOTE — Telephone Encounter (Signed)
Patient's insurance denied payment for Zenpep, insurance will only pay for Creon and generic Creon.

## 2022-03-16 NOTE — Patient Instructions (Signed)
Trial of Zenpep (pancreatic enzymes). Take 2 capsules with meals and 1 capsule with snacks up to 8 per day. Take with first bite of food. Samples provided so you can try before buying prescription.  I will review your recent labs from PCP. We will let you know if additional labs needed now or can wait until Feb 2024.  It is important that we see you at least twice per year to follow you due to cirrhosis. This will include ultrasound and labs twice a year to monitor your liver health.  Eventually you will need a colonoscopy and upper endoscopy but let's try the Zenpep enzymes first.  Keep a stool diary over the next 10 days while on enzymes. Call or send mychart message and let me know your response.   Chronic Pancreatitis  Chronic pancreatitis is permanent inflammation and scarring of the pancreas that leads to pancreatic dysfunction. The pancreas is a gland that is found behind the stomach. The pancreas makes proteins (enzymes) that help to digest food. It also releases hormones called glucagon and insulin. These help regulate blood sugar (glucose). Damage to the pancreas may affect digestion, may cause pain in the upper abdomen and back, and may cause diabetes. Inflammation can also irritate other organs in the abdomen near the pancreas. At first, pancreatitis may be sudden (acute). When you have repeated or long-lasting episodes of acute pancreatitis, damage to the pancreas can be permanent and lead to chronic pancreatitis. Sometimes, though, there is no history of acute pancreatitis. What are the causes? The most common cause of this condition is heavy alcohol use. Other causes include: Hypertriglyceridemia. This is increased, or elevated, levels of triglycerides in the blood. Gallstones or other conditions that block the tube that drains the pancreas (pancreatic duct). Health conditions such as pancreatic cancer or a problem where the body's defense system (immune system) attacks the pancreas  (autoimmune pancreatitis). Hypercalcemia. This is elevated calcium levels in the blood. This condition may be caused by the parathyroid gland being too active (hyperparathyroidism). Having an injured or infected pancreas. Being exposed to certain medicines or certain chemicals. In children, chronic pancreatitis is most often caused by inherited conditions. These come from genes that are passed from parent to child. The most common of these conditions is cystic fibrosis. In some cases, the cause of chronic pancreatitis may not be known. What increases the risk? This condition is more likely to develop in people who: Are female. Are 71-21 years old. Have a family history of pancreatitis. Smoke tobacco. Drink a lot of alcohol over a long period of time. What are the signs or symptoms? Symptoms of this condition may include: Pain in the abdomen or upper back. Pain may be severe and often gets worse after you eat. Nausea and vomiting. Fever. Weight loss. A change in the color and firmness (consistency) of stool (feces), such as stools that are oily, fatty, or clay-colored. How is this diagnosed? This condition is diagnosed based on your symptoms, your medical history, and a physical exam. You may have tests, such as: Blood tests. Stool samples. Biopsy of the pancreas. This is the removal of a sample of pancreas tissue to be tested in a lab. Imaging tests, such as CT scans, MRIs, or an ultrasound of the abdomen. How is this treated? Chronic pancreatitis results in permanent damage that leads to pancreatic dysfunction and long-term (chronic) pain. To manage chronic pancreatitis, you will need to: Stop using alcohol or tobacco. Manage pain. Methods of pain control may  include: Medicines such as those used for pain or depression (antidepressants). Surgery to remove a blockage or buildup of fluid causing pain. A procedure to block the nerves that sense pain in the pancreas (celiac plexus nerve  block). Improve digestion. You may be given: Medicines to replace your pancreatic enzymes. Vitamin supplements. A specific diet to follow. You may work with a dietitian to make an eating plan. Monitor for the development of diabetes. You may need to: Get screened for diabetes regularly. Check your blood glucose levels at home at regular times. Sometimes, acute flares of pain may require hospital treatment. Follow these instructions at home: Eating and drinking     Do not drink alcohol. If you need help quitting, ask your health care provider. Follow a diet as told by your health care provider or dietitian, if this applies. This may include: Limiting how much fat you eat. Eating smaller meals more often. Avoiding caffeine. Drink enough fluid to keep your urine pale yellow. General instructions Take over-the-counter and prescription medicines only as told by your health care provider. These include vitamin supplements. Ask your health care provider if the medicine prescribed to you: Requires you to avoid driving or using machinery. Can cause constipation. You may need to take these actions to prevent or treat constipation: Take over-the-counter or prescription medicines. Eat foods that are high in fiber, such as beans, whole grains, and fresh fruits and vegetables. Limit foods that are high in fat and processed sugars, such as fried or sweet foods. Do not use any products that contain nicotine or tobacco. These products include cigarettes, chewing tobacco, and vaping devices, such as e-cigarettes. If you need help quitting, ask your health care provider. If directed, check your blood sugar at home as told. Keep all follow-up visits. This is important. Contact a health care provider if: You have pain that does not get better with medicine. You have a fever. You have sudden weight loss. Get help right away if: Your pain suddenly gets worse. You have sudden swelling in your  abdomen. You start to vomit often. You have diarrhea that does not go away. You vomit blood or have blood in your stool. You become confused or you have trouble thinking clearly. These symptoms may be an emergency. Get help right away. Call 911. Do not wait to see if the symptoms will go away. Do not drive yourself to the hospital. Summary Chronic pancreatitis is permanent inflammation and scarring of the pancreas that leads to pancreatic dysfunction. Damage to the pancreas may affect digestion, may cause pain in the upper abdomen and back, and may cause diabetes. Inflammation can also irritate other organs in the abdomen near the pancreas. Common causes of this condition are heavy alcohol use, gallstones, increased (elevated) levels of triglycerides, and certain medicines. To manage this condition: control pain, replace enzymes, and do not drink alcohol. This information is not intended to replace advice given to you by your health care provider. Make sure you discuss any questions you have with your health care provider. Document Revised: 05/26/2021 Document Reviewed: 05/26/2021 Elsevier Patient Education  [place].  Cirrhosis  Cirrhosis is long-term (chronic) liver injury. The liver is the body's largest internal organ, and it performs many functions. It converts food into energy, removes toxic material from the blood, makes important proteins, and absorbs necessary vitamins from food. In cirrhosis, healthy liver cells are replaced by scar tissue. This prevents blood from flowing through the liver and makes it difficult for the  liver to complete its functions. What are the causes? Common causes of this condition are hepatitis C and long-term alcohol abuse. Other causes include: Nonalcoholic fatty liver disease (NAFLD). This happens when fat is deposited in the liver by causes other than alcohol. Hepatitis B infection. Autoimmune hepatitis. In this condition, the body's defense  system (immune system) mistakenly attacks the liver cells, causing inflammation. Diseases that cause blockage of ducts inside the liver. Inherited liver diseases, such as hemochromatosis. This is one of the most common inherited liver diseases. In this disease, deposits of iron collect in the liver and other organs. Reactions to certain long-term medicines, such as amiodarone, a heart medicine. Parasitic infections. These include schistosomiasis, which is caused by a flatworm. Long-term contact to certain toxins. These toxins include certain organic solvents, such as toluene and chloroform. What increases the risk? You are more likely to develop this condition if: You have certain types of viral hepatitis. You abuse alcohol, especially if you are female. You are overweight. You use IV drugs and share needles. You have unprotected sex with someone who has viral hepatitis. What are the signs or symptoms? You may not have any signs and symptoms at first. Symptoms may not develop until the damage to your liver starts to get worse. Early symptoms may include: Weakness and tiredness (fatigue). Changes in sleep patterns or having trouble sleeping. Itchiness. Tenderness in the right-upper part of your abdomen. Weight loss and muscle loss. Nausea. Loss of appetite. Later symptoms may include: Fatigue or weakness that is getting worse. Yellow skin and eyes (jaundice). Buildup of fluid in the abdomen (ascites). You may notice that your clothes are tight around your waist. Weight gain and swelling of the feet and ankles (edema). Trouble breathing. Easy bruising and bleeding. Vomiting blood, or black or bloody stool. Mental confusion. How is this diagnosed? Your health care provider may suspect cirrhosis based on your symptoms and medical history, especially if you have other medical conditions or a history of alcohol abuse. Your health care provider will do a physical exam to feel your liver and  to check for signs of cirrhosis. Tests may include: Blood tests to check: For hepatitis B or C. Kidney function. Liver function. Imaging tests such as: MRI or CT scan to look for changes seen in advanced cirrhosis. Ultrasound to see if normal liver tissue is being replaced by scar tissue. A procedure in which a long needle is used to take a sample of liver tissue to be checked in a lab (biopsy). Liver biopsy can confirm the diagnosis of cirrhosis. How is this treated? Treatment for this condition depends on how damaged your liver is and what caused the damage. It may include treating the symptoms of cirrhosis, or treating the underlying causes to slow the damage. Treatment may include: Making lifestyle changes, such as: Eating a healthy diet. You may need to work with your health care provider or a dietitian to develop an eating plan. Restricting salt intake. Maintaining a healthy weight. Not abusing drugs or alcohol. Taking medicines to: Treat liver infections or other infections. Control itching. Reduce fluid buildup. Reduce certain blood toxins. Reduce risk of bleeding from enlarged blood vessels in the stomach or esophagus (varices). Liver transplant. In this procedure, a liver from a donor is used to replace your diseased liver. This is done if cirrhosis has caused liver failure. Other treatments and procedures may be done depending on the problems that you get from cirrhosis. Common problems include liver-related kidney failure (hepatorenal  syndrome). Follow these instructions at home:  Take medicines only as told by your health care provider. Do not use medicines that are toxic to your liver. Ask your health care provider before taking any new medicines, including over-the-counter medicines such as NSAIDs. Rest as needed. Eat a well-balanced diet. Limit your salt or water intake, if your health care provider asks you to do this. Do not drink alcohol. This is especially important  if you routinely take acetaminophen. Keep all follow-up visits. This is important. Contact a health care provider if you: Have fatigue or weakness that is getting worse. Develop swelling of the hands, feet, or legs, or a buildup of fluid in the abdomen (ascites). Have a fever or chills. Develop loss of appetite. Have nausea or vomiting. Develop jaundice. Develop easy bruising or bleeding. Get help right away if you: Vomit bright red blood or a material that looks like coffee grounds. Have blood in your stools. Notice that your stools appear black and tarry. Become confused. Have chest pain or trouble breathing. These symptoms may represent a serious problem that is an emergency. Do not wait to see if the symptoms will go away. Get medical help right away. Call your local emergency services (911 in the [place] Do not drive yourself to the hospital. Summary Cirrhosis is chronic liver injury. Common causes are hepatitis C and long-term alcohol abuse. Tests used to diagnose cirrhosis include blood tests, imaging tests, and liver biopsy. Treatment for this condition involves treating the underlying cause. Avoid alcohol, drugs, salt, and medicines that may damage your liver. Get help right away if you vomit bright red blood or a material that looks like coffee grounds. This information is not intended to replace advice given to you by your health care provider. Make sure you discuss any questions you have with your health care provider. Document Revised: 04/17/2020 Document Reviewed: 04/17/2020 Elsevier Patient Education  [place].

## 2022-03-24 ENCOUNTER — Other Ambulatory Visit: Payer: Self-pay

## 2022-03-24 DIAGNOSIS — R634 Abnormal weight loss: Secondary | ICD-10-CM

## 2022-03-24 DIAGNOSIS — K7460 Unspecified cirrhosis of liver: Secondary | ICD-10-CM

## 2022-03-24 DIAGNOSIS — K861 Other chronic pancreatitis: Secondary | ICD-10-CM

## 2022-03-24 DIAGNOSIS — R7989 Other specified abnormal findings of blood chemistry: Secondary | ICD-10-CM

## 2022-03-24 MED ORDER — PANCRELIPASE (LIP-PROT-AMYL) 36000-114000 UNITS PO CPEP: ORAL_CAPSULE | ORAL | 5 refills | Status: DC

## 2022-03-24 NOTE — Telephone Encounter (Signed)
Darl Pikes, please NIC for the RUQ U/S. Thanks.

## 2022-03-24 NOTE — Telephone Encounter (Signed)
Pt scheduled for 07/28/22 at 10am

## 2022-03-24 NOTE — Telephone Encounter (Signed)
Perfect. Thanks.

## 2022-03-24 NOTE — Telephone Encounter (Signed)
Hailey Greer, I thought Zenpep not on formulary and I had to send in generic creon?  If she wants to schedule screening colonoscopy ok to schedule with [facility]. ASA 2.

## 2022-03-24 NOTE — Addendum Note (Signed)
Addended by: Tiffany Kocher on: 03/24/2022 02:05 PM   Modules accepted: Orders

## 2022-03-24 NOTE — Telephone Encounter (Signed)
Pt was made aware, pt states that she is not having diarrhea that the Zenpep is working well for her. Labs have been ordered and will be mailed to the patient closer to time to have completed. Patient is wanting to stay on the Zenpep, pt is going to drop off financial information to try to get approved for patient assistance.   Mandy: Please put in a recall for an [place] in 06/2022 for hepatoma screening.

## 2022-03-24 NOTE — Telephone Encounter (Signed)
Reviewed recent labs dated 03/05/2022:  Hemoglobin A1c 6.10, glucose 145, creatinine 0.81, sodium 140, albumin 4.3, total bilirubin 0.4, alkaline phosphatase 157 high (down from 247), AST 30, ALT 42 high.   I will send in generic creon.  Plan for labs 08/2022, please arrange (CMET, PT/INR, CBC, GGT, AFP). Needs RUQ U/S in 06/2022, hepatoma screening. If no improvement in diarrhea on pancreatic enzymes then would advise colonoscopy. If diarrhea better, then colonoscopy for screening, whenever she is ready.  Plan for return OV in 4 months. Please schedule.

## 2022-03-29 ENCOUNTER — Telehealth: Payer: Self-pay

## 2022-03-29 NOTE — Telephone Encounter (Signed)
Patient assistance through [facility] [employer] for Zenpep has been approved through 07/18/2022. The approval letter will be scanned into patient's chart.

## 2022-03-29 NOTE — Telephone Encounter (Signed)
On recall  °

## 2022-03-29 NOTE — Telephone Encounter (Signed)
Thanks

## 2022-05-26 ENCOUNTER — Telehealth: Payer: Self-pay | Admitting: Internal Medicine

## 2022-05-26 NOTE — Telephone Encounter (Signed)
Patient is on recall for 6 mth US 

## 2022-05-26 NOTE — Telephone Encounter (Signed)
Letter mailed

## 2022-06-02 ENCOUNTER — Telehealth: Payer: Self-pay | Admitting: *Deleted

## 2022-06-02 ENCOUNTER — Other Ambulatory Visit: Payer: Self-pay | Admitting: *Deleted

## 2022-06-02 DIAGNOSIS — K7460 Unspecified cirrhosis of liver: Secondary | ICD-10-CM

## 2022-06-02 NOTE — Telephone Encounter (Signed)
[place] scheduled for 06/15/22 at 9:30 am, arrive at 9:15 am, nothing to eat or drink after midnight. Pt informed verbalized understanding

## 2022-06-15 ENCOUNTER — Ambulatory Visit (HOSPITAL_COMMUNITY)
Admission: RE | Admit: 2022-06-15 | Discharge: 2022-06-15 | Disposition: A | Discharge status: Home / Self Care | Payer: 59 | Source: Ambulatory Visit | Attending: Gastroenterology | Admitting: Gastroenterology

## 2022-06-15 DIAGNOSIS — K7460 Unspecified cirrhosis of liver: Secondary | ICD-10-CM | POA: Diagnosis not present

## 2022-06-15 DIAGNOSIS — K838 Other specified diseases of biliary tract: Secondary | ICD-10-CM | POA: Diagnosis not present

## 2022-06-15 DIAGNOSIS — R945 Abnormal results of liver function studies: Secondary | ICD-10-CM | POA: Diagnosis not present

## 2022-06-15 DIAGNOSIS — Z9049 Acquired absence of other specified parts of digestive tract: Secondary | ICD-10-CM | POA: Diagnosis not present

## 2022-07-28 ENCOUNTER — Ambulatory Visit: Payer: 59 | Admitting: Gastroenterology

## 2022-08-09 IMAGING — CT CT CERVICAL SPINE W/O CM
3 series · 10 of 33 positions shown, 12 images · non-contrast
Comparison: None.

CLINICAL DATA: Found down

EXAM:
CT HEAD WITHOUT CONTRAST
CT MAXILLOFACIAL WITHOUT CONTRAST
CT CERVICAL SPINE WITHOUT CONTRAST
TECHNIQUE: Multidetector CT imaging of the head, cervical spine, and
maxillofacial structures were performed using the standard protocol
without intravenous contrast. Multiplanar CT image reconstructions
of the cervical spine and maxillofacial structures were also
generated.

[Series 4: c spine soft · axial · 0.28mm/px · z∈[+1081,+1151]mm · 2 of 76 slices shown, 3 images]
[im 24/76  soft-tissue]
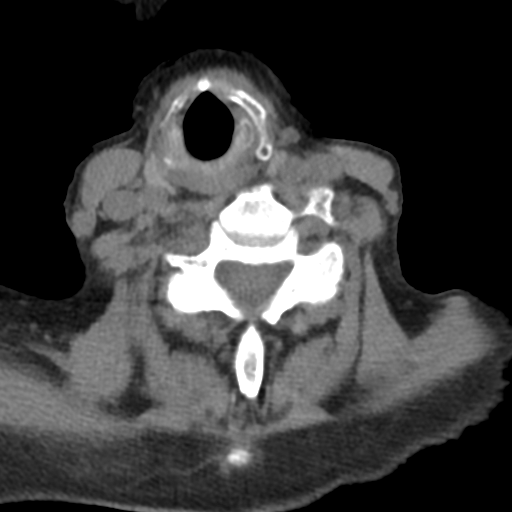
[im 24/76  bone]
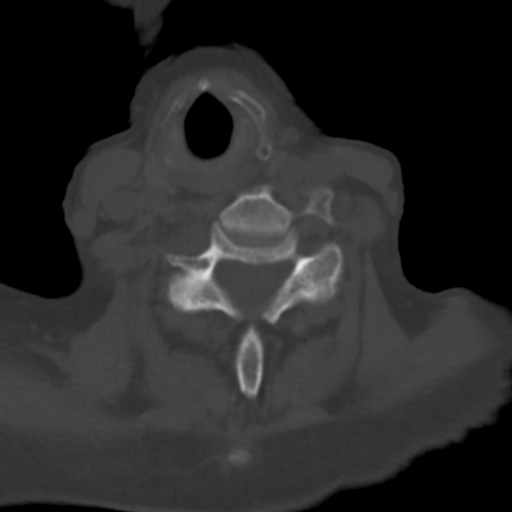
[im 58/76  bone]
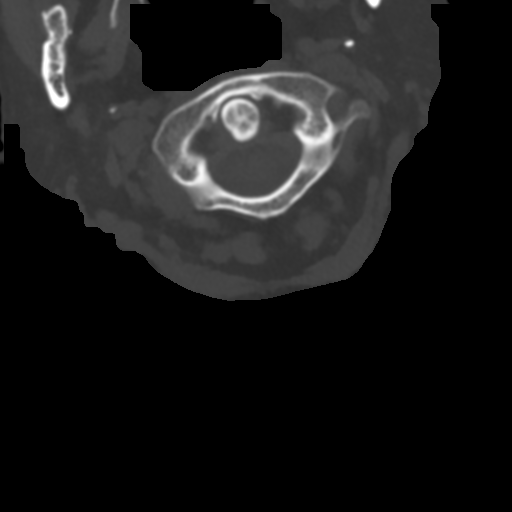

[Series 5: sag bone · sagittal · 0.21mm/px · 5 of 74 slices shown, 6 images]
[im 25/74  bone]
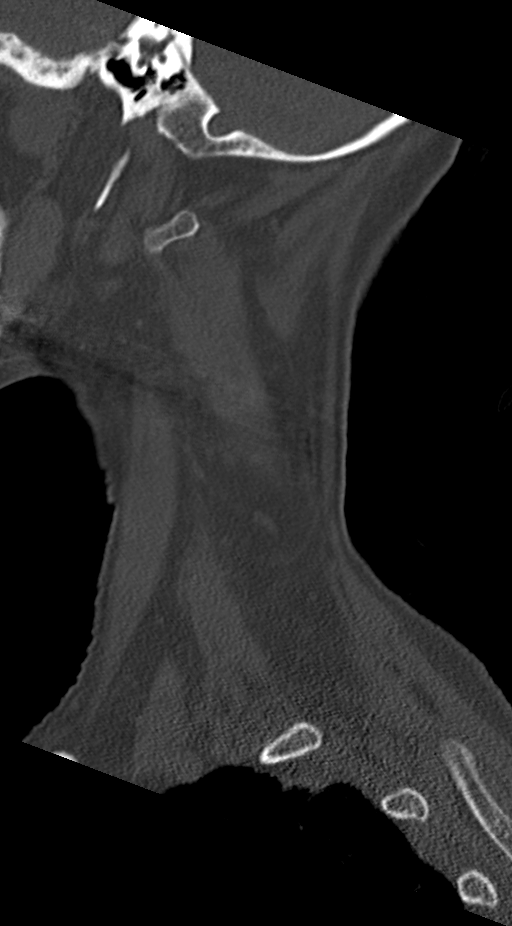
[im 31/74  bone]
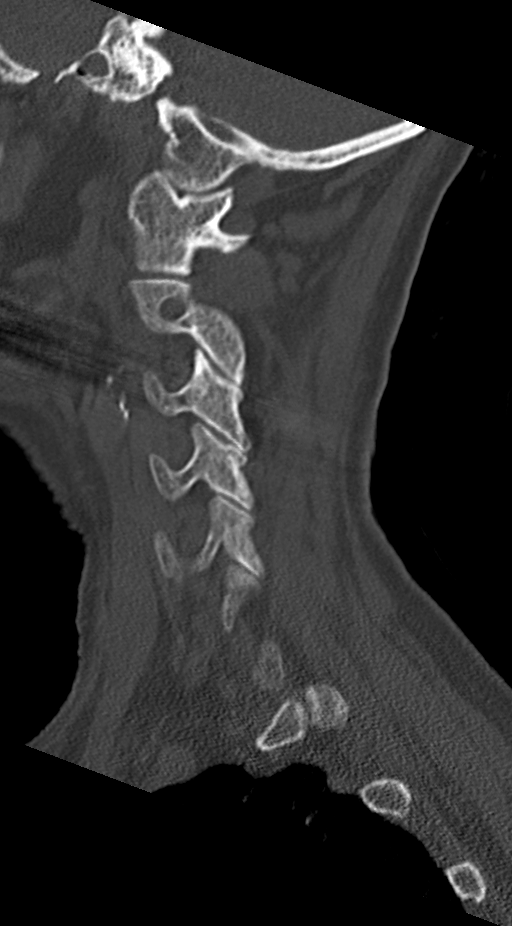
[im 37/74  soft-tissue]
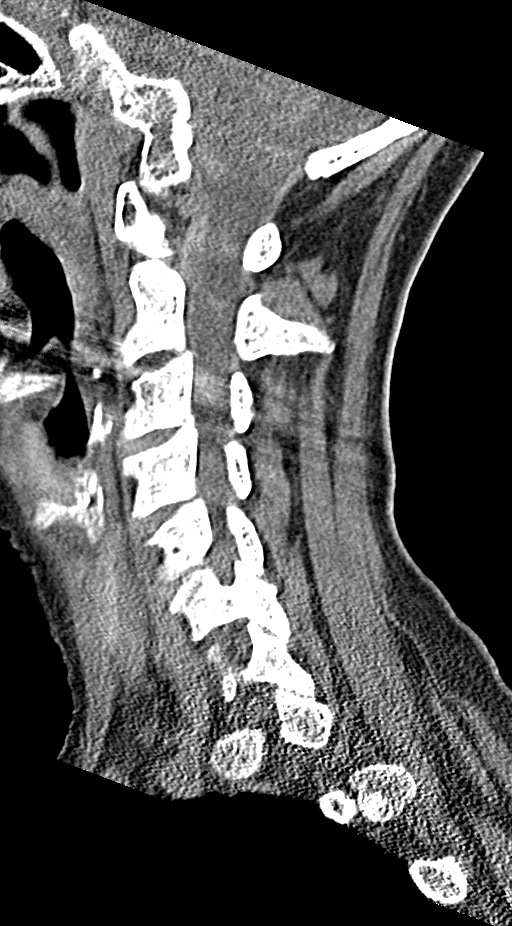
[im 37/74  bone]
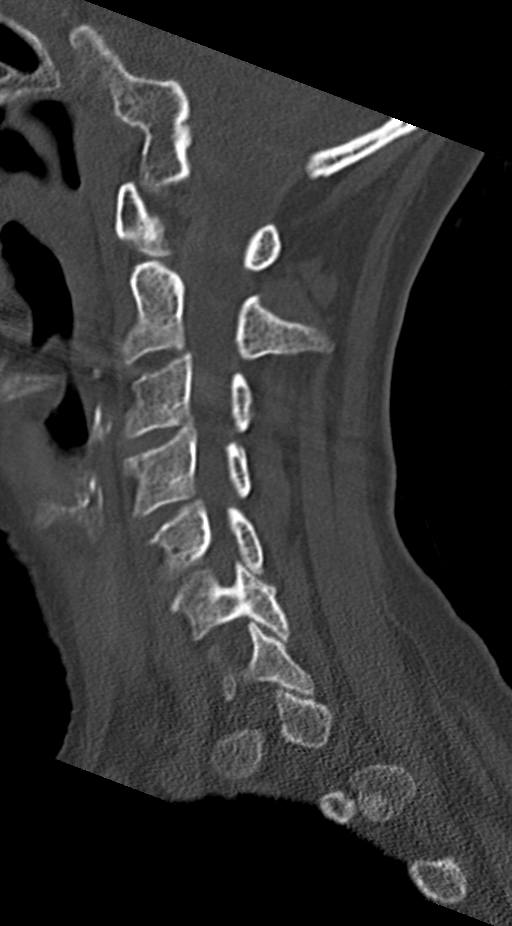
[im 43/74  bone]
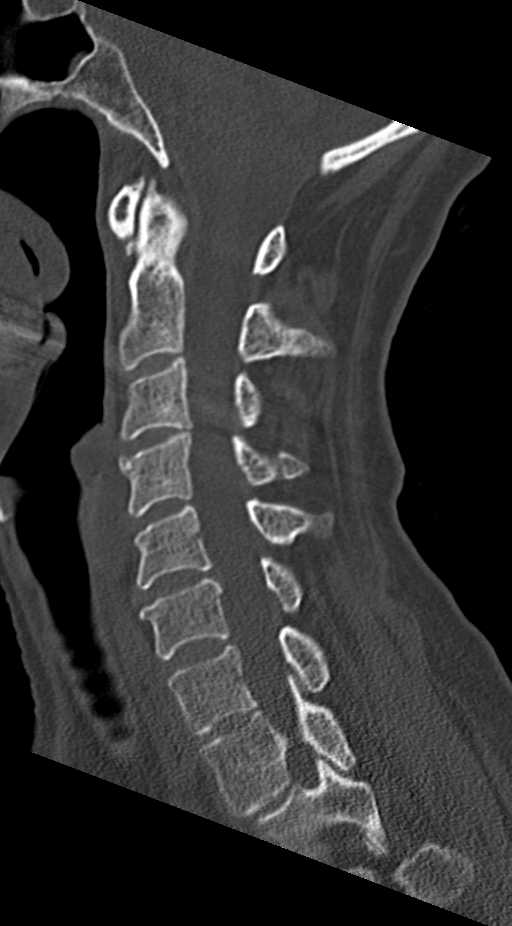
[im 49/74  bone]
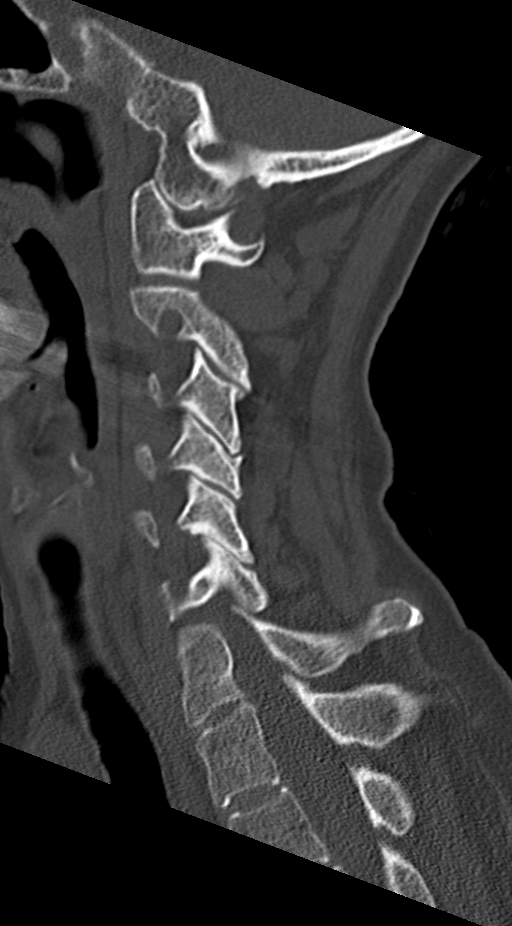

[Series 6: cor bone · coronal · 0.25mm/px · 3 of 53 slices shown]
[im 12/53  bone]
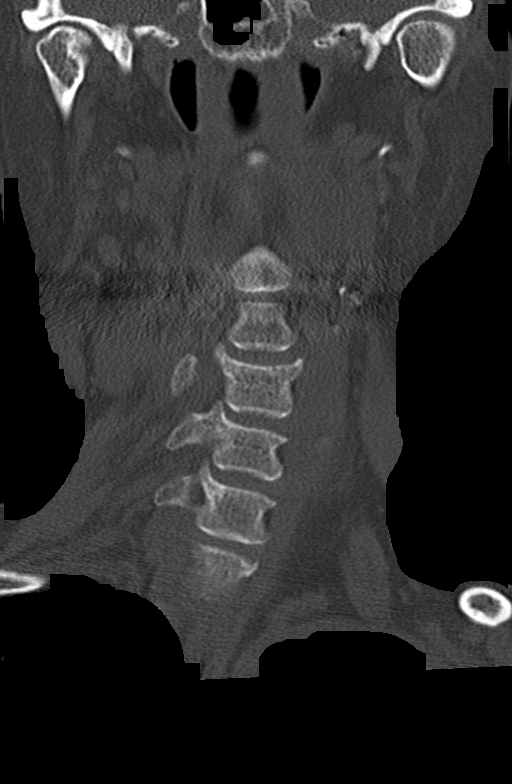
[im 22/53  bone]
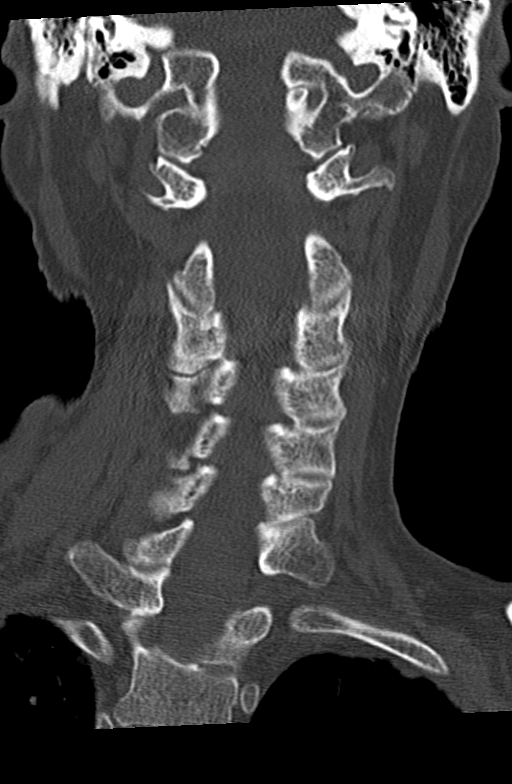
[im 32/53  bone]
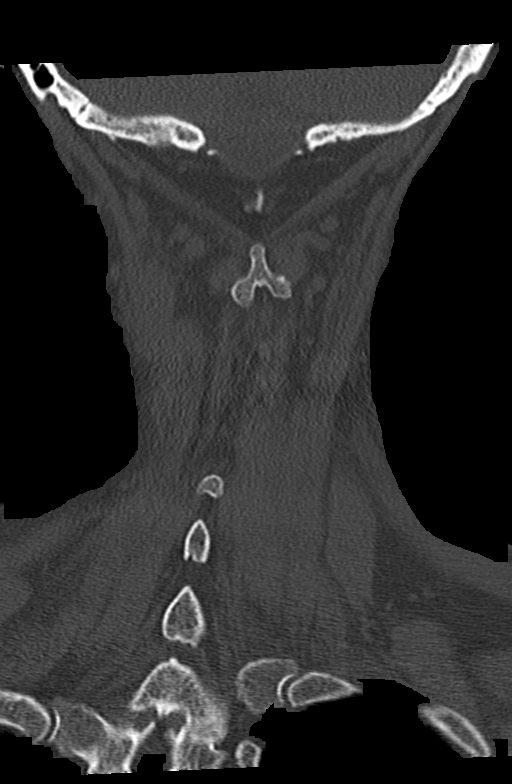

[10 of 33 positions shown; findings below may reference images not displayed]

FINDINGS: CT HEAD FINDINGS

Brain: There is no mass, hemorrhage or extra-axial collection. The
size and configuration of the ventricles and extra-axial CSF spaces
are normal. There is hypoattenuation of the periventricular white
matter, most commonly indicating chronic ischemic microangiopathy.

Vascular: No abnormal hyperdensity of the major intracranial
arteries or dural venous sinuses. No intracranial atherosclerosis.

Skull: The visualized skull base, calvarium and extracranial soft
tissues are normal.

CT MAXILLOFACIAL FINDINGS

Osseous:

--Complex facial fracture types: No LeFort, zygomaticomaxillary
complex or nasoorbitoethmoidal fracture.

--Simple fracture types: None.

--Mandible: No fracture or dislocation. Old posttraumatic deformity
of the right mandible.

Orbits: The globes are intact. Normal appearance of the intra- and
extraconal fat. Symmetric extraocular muscles and optic nerves. Left
periorbital soft tissue swelling.

Sinuses: No fluid levels or advanced mucosal thickening.

Soft tissues: Normal visualized extracranial soft tissues.

CT CERVICAL SPINE FINDINGS

Alignment: No static subluxation. Facets are aligned. Occipital
condyles and the lateral masses of C1-C2 are aligned.

Skull base and vertebrae: No acute fracture.

Soft tissues and spinal canal: No prevertebral fluid or swelling. No
visible canal hematoma.

Disc levels: No advanced spinal canal or neural foraminal stenosis.

Upper chest: No pneumothorax, pulmonary nodule or pleural effusion.

Other: Normal visualized paraspinal cervical soft tissues.
IMPRESSION: 1. Chronic ischemic microangiopathy without acute intracranial
abnormality.
2. Left periorbital soft tissue swelling without facial or skull
fracture.
3. No acute fracture or static subluxation of the cervical spine.

## 2022-08-09 IMAGING — CT CT MAXILLOFACIAL W/O CM
3 of 4 series · 15 of 47 positions shown, 18 images · non-contrast
Comparison: None.

CLINICAL DATA: Found down

EXAM:
CT HEAD WITHOUT CONTRAST
CT MAXILLOFACIAL WITHOUT CONTRAST
CT CERVICAL SPINE WITHOUT CONTRAST
TECHNIQUE: Multidetector CT imaging of the head, cervical spine, and
maxillofacial structures were performed using the standard protocol
without intravenous contrast. Multiplanar CT image reconstructions
of the cervical spine and maxillofacial structures were also
generated.

[Series 2: max soft · axial · 0.30mm/px · z∈[+1069,+1219]mm · 9 of 88 slices shown, 12 images]
[im 7/88  brain]
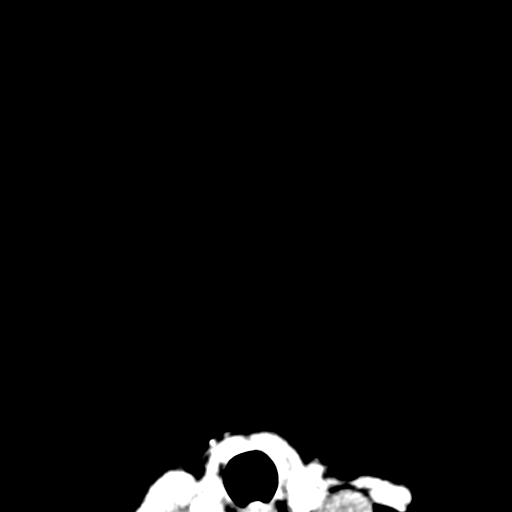
[im 7/88  bone]
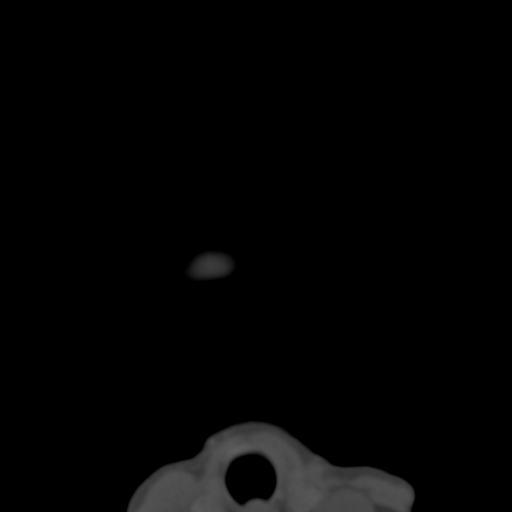
[im 16/88  bone]
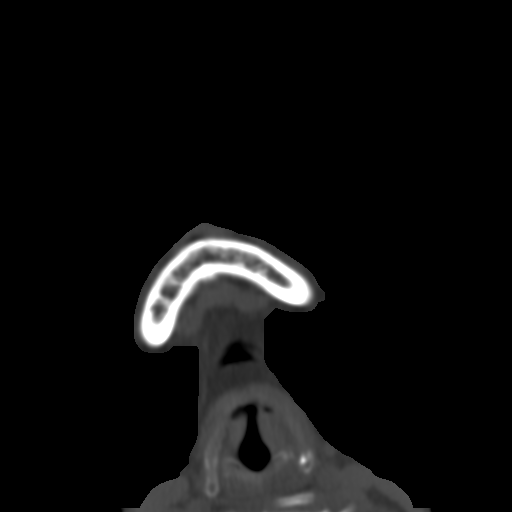
[im 25/88  bone]
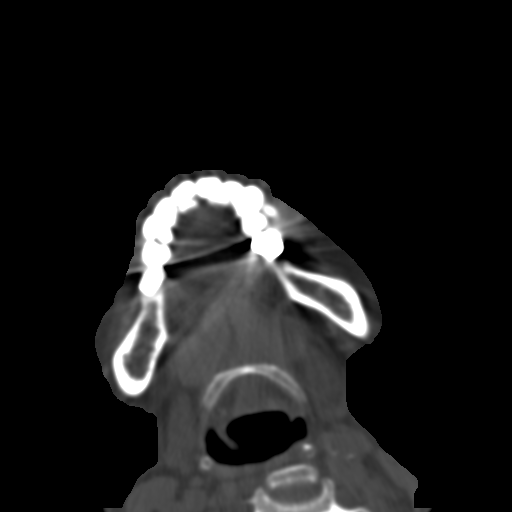
[im 34/88  bone]
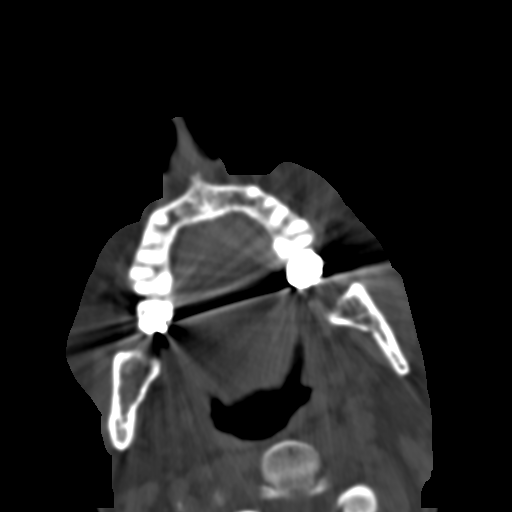
[im 46/88  brain]
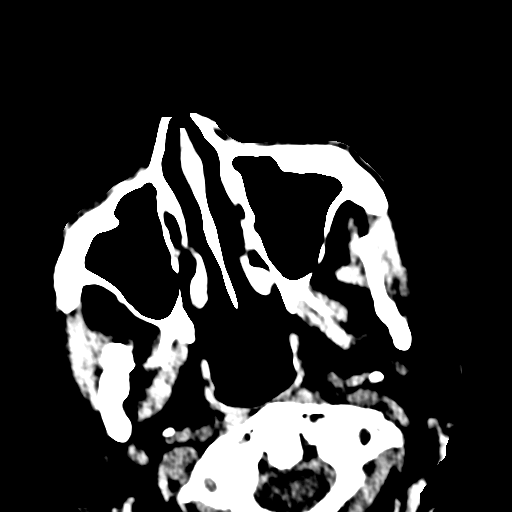
[im 46/88  bone]
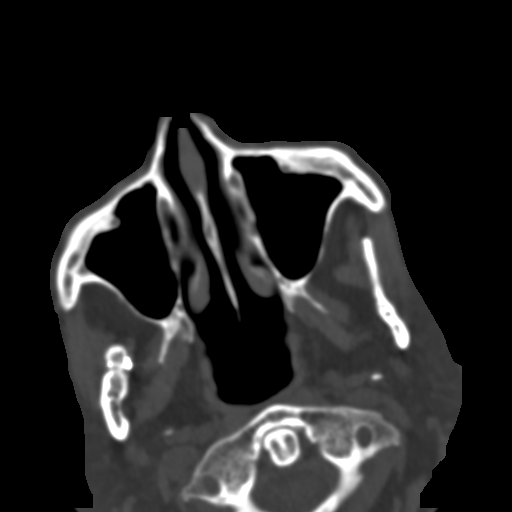
[im 55/88  bone]
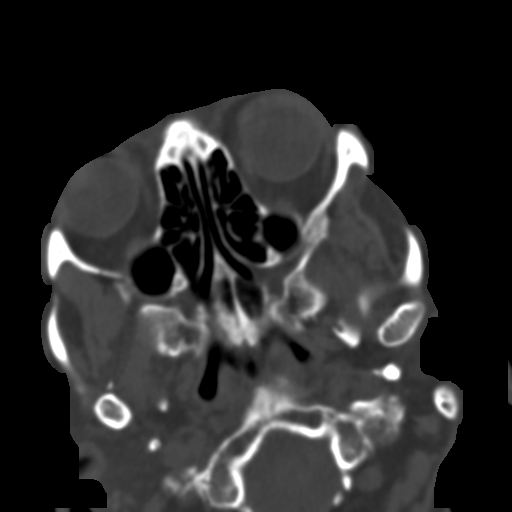
[im 64/88  bone]
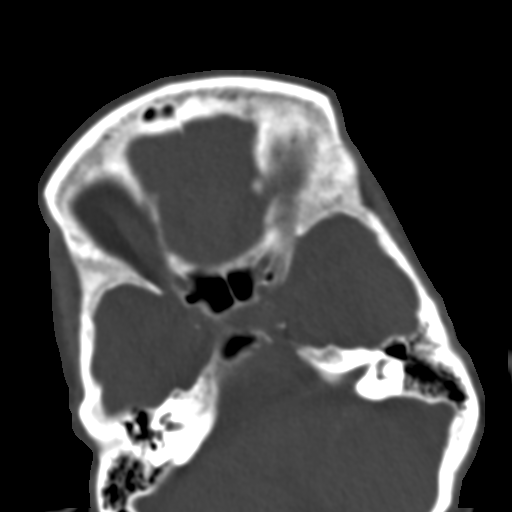
[im 73/88  bone]
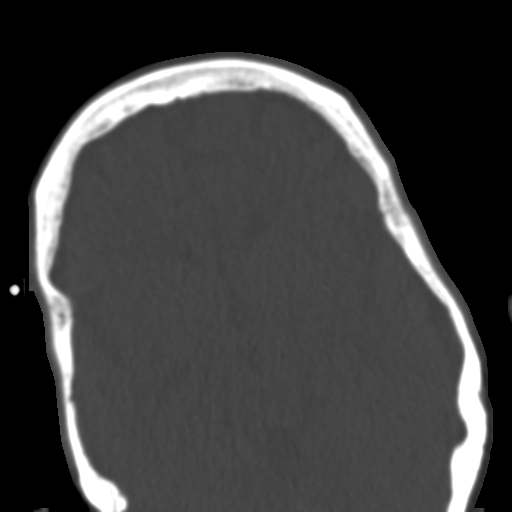
[im 82/88  brain]
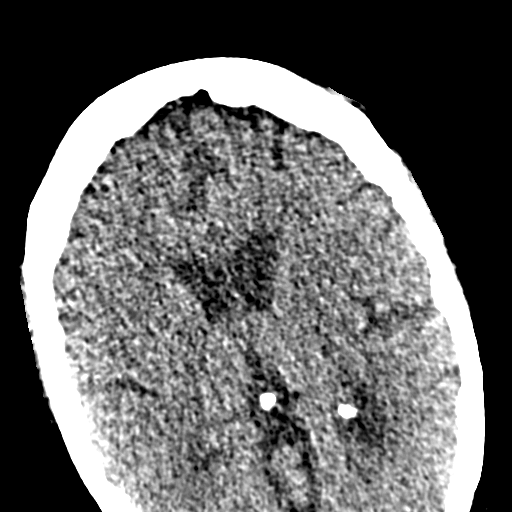
[im 82/88  bone]
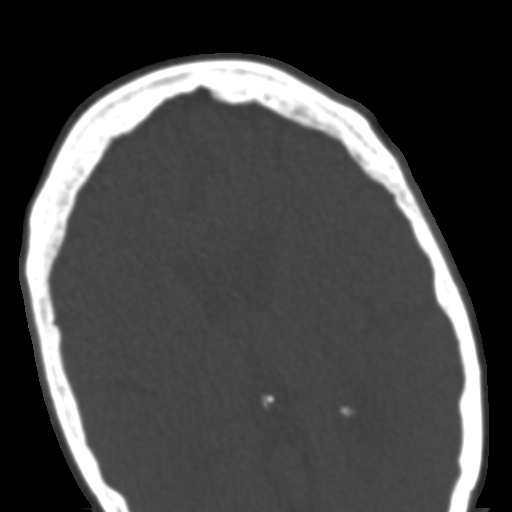

[Series 7: sagittal soft · sagittal · 0.30mm/px · 3 of 79 slices shown]
[im 27/79  bone]
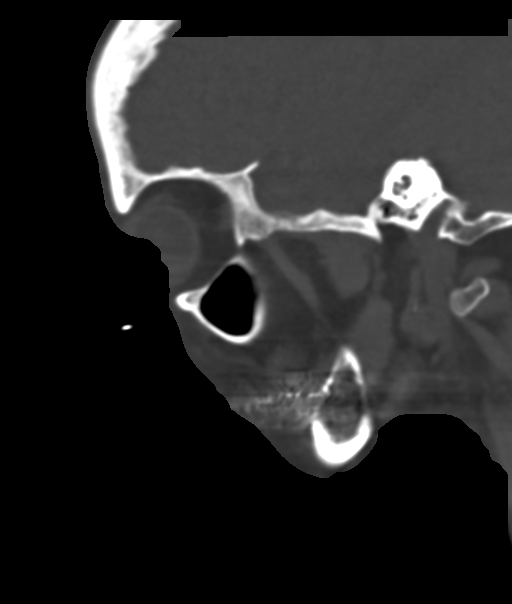
[im 40/79  bone]
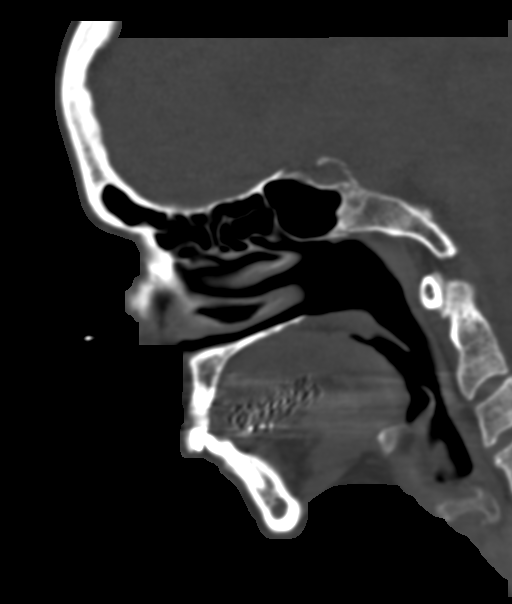
[im 53/79  bone]
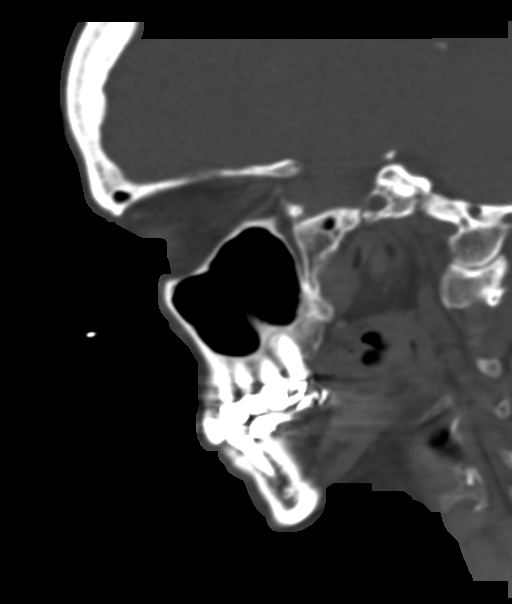

[Series 8: coronal bone · coronal · 0.35mm/px · 3 of 77 slices shown]
[im 20/77  bone]
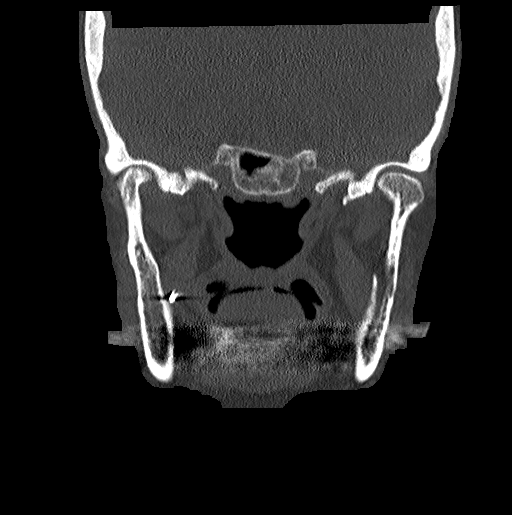
[im 39/77  bone]
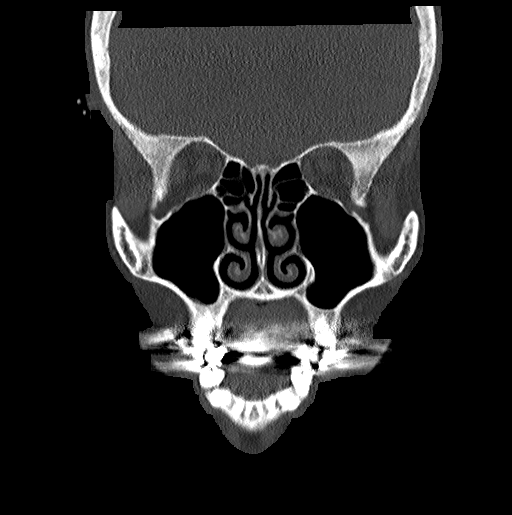
[im 58/77  bone]
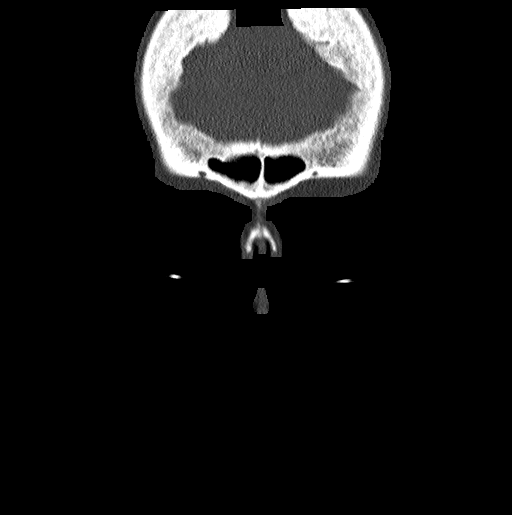

[15 of 47 positions shown; findings below may reference images not displayed]

FINDINGS: CT HEAD FINDINGS

Brain: There is no mass, hemorrhage or extra-axial collection. The
size and configuration of the ventricles and extra-axial CSF spaces
are normal. There is hypoattenuation of the periventricular white
matter, most commonly indicating chronic ischemic microangiopathy.

Vascular: No abnormal hyperdensity of the major intracranial
arteries or dural venous sinuses. No intracranial atherosclerosis.

Skull: The visualized skull base, calvarium and extracranial soft
tissues are normal.

CT MAXILLOFACIAL FINDINGS

Osseous:

--Complex facial fracture types: No LeFort, zygomaticomaxillary
complex or nasoorbitoethmoidal fracture.

--Simple fracture types: None.

--Mandible: No fracture or dislocation. Old posttraumatic deformity
of the right mandible.

Orbits: The globes are intact. Normal appearance of the intra- and
extraconal fat. Symmetric extraocular muscles and optic nerves. Left
periorbital soft tissue swelling.

Sinuses: No fluid levels or advanced mucosal thickening.

Soft tissues: Normal visualized extracranial soft tissues.

CT CERVICAL SPINE FINDINGS

Alignment: No static subluxation. Facets are aligned. Occipital
condyles and the lateral masses of C1-C2 are aligned.

Skull base and vertebrae: No acute fracture.

Soft tissues and spinal canal: No prevertebral fluid or swelling. No
visible canal hematoma.

Disc levels: No advanced spinal canal or neural foraminal stenosis.

Upper chest: No pneumothorax, pulmonary nodule or pleural effusion.

Other: Normal visualized paraspinal cervical soft tissues.
IMPRESSION: 1. Chronic ischemic microangiopathy without acute intracranial
abnormality.
2. Left periorbital soft tissue swelling without facial or skull
fracture.
3. No acute fracture or static subluxation of the cervical spine.

## 2022-08-12 ENCOUNTER — Other Ambulatory Visit: Payer: Self-pay

## 2022-08-12 DIAGNOSIS — K861 Other chronic pancreatitis: Secondary | ICD-10-CM

## 2022-08-12 DIAGNOSIS — R634 Abnormal weight loss: Secondary | ICD-10-CM

## 2022-08-12 DIAGNOSIS — K7460 Unspecified cirrhosis of liver: Secondary | ICD-10-CM

## 2022-08-12 DIAGNOSIS — R7989 Other specified abnormal findings of blood chemistry: Secondary | ICD-10-CM

## 2022-08-24 DIAGNOSIS — K7460 Unspecified cirrhosis of liver: Secondary | ICD-10-CM | POA: Diagnosis not present

## 2022-08-24 DIAGNOSIS — R7989 Other specified abnormal findings of blood chemistry: Secondary | ICD-10-CM | POA: Diagnosis not present

## 2022-08-24 DIAGNOSIS — R634 Abnormal weight loss: Secondary | ICD-10-CM | POA: Diagnosis not present

## 2022-08-24 DIAGNOSIS — K861 Other chronic pancreatitis: Secondary | ICD-10-CM | POA: Diagnosis not present

## 2022-08-26 LAB — CBC WITH DIFFERENTIAL/PLATELET
Basophils Absolute: 0 10*3/uL (ref 0.0–0.2)
Basos: 0 %
EOS (ABSOLUTE): 0.2 10*3/uL (ref 0.0–0.4)
Eos: 2 %
Hematocrit: 43 % (ref 34.0–46.6)
Hemoglobin: 14.4 g/dL (ref 11.1–15.9)
Immature Grans (Abs): 0.1 10*3/uL (ref 0.0–0.1)
Immature Granulocytes: 1 %
Lymphocytes Absolute: 3.8 10*3/uL — ABNORMAL HIGH (ref 0.7–3.1)
Lymphs: 38 %
MCH: 30.5 pg (ref 26.6–33.0)
MCHC: 33.5 g/dL (ref 31.5–35.7)
MCV: 91 fL (ref 79–97)
Monocytes Absolute: 0.8 10*3/uL (ref 0.1–0.9)
Monocytes: 8 %
Neutrophils Absolute: 5.2 10*3/uL (ref 1.4–7.0)
Neutrophils: 51 %
Platelets: 244 10*3/uL (ref 150–450)
RBC: 4.72 x10E6/uL (ref 3.77–5.28)
RDW: 13 % (ref 11.7–15.4)
WBC: 9.9 10*3/uL (ref 3.4–10.8)

## 2022-08-26 LAB — COMPREHENSIVE METABOLIC PANEL
ALT: 49 IU/L — ABNORMAL HIGH (ref 0–32)
AST: 33 IU/L (ref 0–40)
Albumin/Globulin Ratio: 1.8 (ref 1.2–2.2)
Albumin: 4.5 g/dL (ref 3.9–4.9)
Alkaline Phosphatase: 193 IU/L — ABNORMAL HIGH (ref 44–121)
BUN/Creatinine Ratio: 9 — ABNORMAL LOW (ref 12–28)
BUN: 7 mg/dL — ABNORMAL LOW (ref 8–27)
Bilirubin Total: 0.2 mg/dL (ref 0.0–1.2)
CO2: 25 mmol/L (ref 20–29)
Calcium: 9.7 mg/dL (ref 8.7–10.3)
Chloride: 102 mmol/L (ref 96–106)
Creatinine, Ser: 0.75 mg/dL (ref 0.57–1.00)
Globulin, Total: 2.5 g/dL (ref 1.5–4.5)
Glucose: 107 mg/dL — ABNORMAL HIGH (ref 70–99)
Potassium: 4.6 mmol/L (ref 3.5–5.2)
Sodium: 141 mmol/L (ref 134–144)
Total Protein: 7 g/dL (ref 6.0–8.5)
eGFR: 89 mL/min/{1.73_m2} (ref >59)

## 2022-08-26 LAB — PROTIME-INR
INR: 0.9 (ref 0.9–1.2)
Prothrombin Time: 10.3 s (ref 9.1–12.0)

## 2022-08-26 LAB — AFP TUMOR MARKER: AFP, Serum, Tumor Marker: 6 ng/mL (ref 0.0–9.2)

## 2022-08-26 LAB — GAMMA GT: GGT: 121 IU/L — ABNORMAL HIGH (ref 0–60)

## 2022-08-30 ENCOUNTER — Other Ambulatory Visit: Payer: Self-pay

## 2022-08-30 ENCOUNTER — Ambulatory Visit: Payer: 59 | Admitting: Gastroenterology

## 2022-08-30 DIAGNOSIS — R7989 Other specified abnormal findings of blood chemistry: Secondary | ICD-10-CM

## 2022-08-30 DIAGNOSIS — K7460 Unspecified cirrhosis of liver: Secondary | ICD-10-CM

## 2022-08-31 ENCOUNTER — Ambulatory Visit: Payer: 59 | Admitting: Gastroenterology

## 2022-08-31 NOTE — Progress Notes (Incomplete)
GI Office Note    Referring Provider: Richelle Ito Primary Care Physician:  Richelle Ito  Primary Gastroenterologist:  Chief Complaint   No chief complaint on file.   History of Present Illness   Hailey Greer is a 62 y.o. female presenting today          Medications   Current Outpatient Medications  Medication Sig Dispense Refill   CALCIUM PO Take 1 tablet by mouth daily.     Cholecalciferol (VITAMIN D) 125 MCG (5000 UT) CAPS Take by mouth.     lipase/protease/amylase (CREON) 36000 UNITS CPEP capsule Take two capsules with first bite of breakfast, lunch, and supper. Take 1 capsule with first bite of snacks. 240 capsule 5   Magnesium 500 MG CAPS Take by mouth.     Multiple Vitamins-Minerals (CENTRUM SILVER PO) Take 1 tablet by mouth daily.     Omega-3 Fatty Acids (FISH OIL) 1000 MG CAPS Take by mouth.     Zinc 100 MG TABS Take by mouth.     No current facility-administered medications for this visit.    Allergies   Allergies as of 08/31/2022 - Review Complete 03/16/2022  Allergen Reaction Noted   Codeine  07/08/2014     Past Medical History   Past Medical History:  Diagnosis Date   Anxiety    COPD (chronic obstructive pulmonary disease) ([place])    Depression    h/o suicide attempty with xanax in 03/2021   H/O ETOH abuse    Hypertension    Pancreatitis     Past Surgical History   Past Surgical History:  Procedure Laterality Date   CHOLECYSTECTOMY N/A 07/24/2014   Procedure: LAPAROSCOPIC CHOLECYSTECTOMY;  Surgeon: Jamesetta So, MD;  Location: [facility];  Service: General;  Laterality: N/A;   TUBAL LIGATION      Past Family History   Family History  Problem Relation Age of Onset   Cancer Mother        liver   Pancreatic cancer Maternal Grandfather    Celiac disease Neg Hx    Colon cancer Neg Hx    Inflammatory bowel disease Neg Hx     Past Social History   Social History   Socioeconomic History   Marital status:  Married    Spouse name: Not on file   Number of children: Not on file   Years of education: Not on file   Highest education level: Not on file  Occupational History   Not on file  Tobacco Use   Smoking status: Every Day    Packs/day: 0.50    Years: 20.00    Total pack years: 10.00    Types: Cigarettes   Smokeless tobacco: Never  Substance and Sexual Activity   Alcohol use: No    Comment: h/o etoh abuse. per PCP 08/2021 note she was drinking one mixed drink per day. No etoh in one year per pt (03/16/22).  Patient states that she never drank heavily but did drink daily, usually 2 wine coolers.   Drug use: No   Sexual activity: Yes  Other Topics Concern   Not on file  Social History Narrative   Not on file   Social Determinants of Health   Financial Resource Strain: Not on file  Food Insecurity: Not on file  Transportation Needs: Not on file  Physical Activity: Not on file  Stress: Not on file  Social Connections: Not on file  Intimate Partner Violence: Not on file  Review of Systems   General: Negative for anorexia, weight loss, fever, chills, fatigue, weakness. ENT: Negative for hoarseness, difficulty swallowing , nasal congestion. CV: Negative for chest pain, angina, palpitations, dyspnea on exertion, peripheral edema.  Respiratory: Negative for dyspnea at rest, dyspnea on exertion, cough, sputum, wheezing.  GI: See history of present illness. GU:  Negative for dysuria, hematuria, urinary incontinence, urinary frequency, nocturnal urination.  Endo: Negative for unusual weight change.     Physical Exam   There were no vitals taken for this visit.   General: Well-nourished, well-developed in no acute distress.  Eyes: No icterus. Mouth: Oropharyngeal mucosa moist and pink , no lesions erythema or exudate. Lungs: Clear to auscultation bilaterally.  Heart: Regular rate and rhythm, no murmurs rubs or gallops.  Abdomen: Bowel sounds are normal, nontender, nondistended,  no hepatosplenomegaly or masses,  no abdominal bruits or hernia , no rebound or guarding.  Rectal: ***  Extremities: No lower extremity edema. No clubbing or deformities. Neuro: Alert and oriented x 4   Skin: Warm and dry, no jaundice.   Psych: Alert and cooperative, normal mood and affect.  Labs   *** Imaging Studies   No results found.  Assessment       PLAN   ***   Laureen Ochs. Bobby Rumpf, [place], [facility]

## 2022-09-13 NOTE — Progress Notes (Unsigned)
GI Office Note    Referring Provider: Richelle Ito Primary Care Physician:  Richelle Ito  Primary Gastroenterologist: Garfield Cornea, MD   Chief Complaint   No chief complaint on file.   History of Present Illness   Hailey Greer is a 62 y.o. female presenting today for follow-up.  Last seen in August 2023.  History of weight loss, greasy stools.History of acute hemorrhagic pancreatitis complicated by acute respiratory failure and acute renal failure back in 2014. Triglycerides previously moderately elevated at 770 in 2015. Per records, moderate alcohol use at that time. Gallbladder removed in 2016. No problems since that time. Autoimmune pancreatitis work-up negative. Documented elevated alcohol level fall 2022 while in the ED at [facility]. She presented with intentional overdose on Xanax at that time. Elevated LFTs at that time.   Labs from April 2023, celiac screen negative.  Hepatitis B and C markers negative.  No iron overload.  LFTs better, alkaline phosphatase still abnormal at 247 but better, AST 45.   CT abdomen pelvis with contrast June 2023 with findings of chronic pancreatitis, findings suspicious for hepatic cirrhosis and possible portal venous hypertension.  Mitochondrial antibodies negative, ANA negative, immunoglobulins unremarkable.  C. difficile negative.    No prior colonoscopy or EGD.  Trial of Zenpep, not on formulary patient assistance offered.  Creon on formulary of the cost $500.  Labs from August 24, 2022: GGT 121, AFP 6, glucose 107, BUN 7, creatinine 0.75, sodium 141, albumin 4.5, total bilirubin 0.2, alkaline phosphatase 193, AST 33, ALT 49, white blood cell count 9900, hemoglobin 14.4, platelets 244,000, INR 0.9.  AMA previously negative.  MELD sodium 6.  Plan for labs in 3 months including LFTs, AMA, hepatitis B surface antibody, hepatitis A antibody.  Right upper quadrant ultrasound November 2023: Mild intrahepatic biliary  ductal dilatation.  Common bile duct 4.6 mm.  Liver unremarkable. Medications   Current Outpatient Medications  Medication Sig Dispense Refill   CALCIUM PO Take 1 tablet by mouth daily.     Cholecalciferol (VITAMIN D) 125 MCG (5000 UT) CAPS Take by mouth.     lipase/protease/amylase (CREON) 36000 UNITS CPEP capsule Take two capsules with first bite of breakfast, lunch, and supper. Take 1 capsule with first bite of snacks. 240 capsule 5   Magnesium 500 MG CAPS Take by mouth.     Multiple Vitamins-Minerals (CENTRUM SILVER PO) Take 1 tablet by mouth daily.     Omega-3 Fatty Acids (FISH OIL) 1000 MG CAPS Take by mouth.     Zinc 100 MG TABS Take by mouth.     No current facility-administered medications for this visit.    Allergies   Allergies as of 09/14/2022 - Review Complete 03/16/2022  Allergen Reaction Noted   Codeine  07/08/2014     Past Medical History   Past Medical History:  Diagnosis Date   Anxiety    COPD (chronic obstructive pulmonary disease) ([place])    Depression    h/o suicide attempty with xanax in 03/2021   H/O ETOH abuse    Hypertension    Pancreatitis     Past Surgical History   Past Surgical History:  Procedure Laterality Date   CHOLECYSTECTOMY N/A 07/24/2014   Procedure: LAPAROSCOPIC CHOLECYSTECTOMY;  Surgeon: Jamesetta So, MD;  Location: [facility];  Service: General;  Laterality: N/A;   TUBAL LIGATION      Past Family History   Family History  Problem Relation Age of Onset  Cancer Mother        liver   Pancreatic cancer Maternal Grandfather    Celiac disease Neg Hx    Colon cancer Neg Hx    Inflammatory bowel disease Neg Hx     Past Social History   Social History   Socioeconomic History   Marital status: Married    Spouse name: Not on file   Number of children: Not on file   Years of education: Not on file   Highest education level: Not on file  Occupational History   Not on file  Tobacco Use   Smoking status: Every Day    Packs/day:  0.50    Years: 20.00    Total pack years: 10.00    Types: Cigarettes   Smokeless tobacco: Never  Substance and Sexual Activity   Alcohol use: No    Comment: h/o etoh abuse. per PCP 08/2021 note she was drinking one mixed drink per day. No etoh in one year per pt (03/16/22).  Patient states that she never drank heavily but did drink daily, usually 2 wine coolers.   Drug use: No   Sexual activity: Yes  Other Topics Concern   Not on file  Social History Narrative   Not on file   Social Determinants of Health   Financial Resource Strain: Not on file  Food Insecurity: Not on file  Transportation Needs: Not on file  Physical Activity: Not on file  Stress: Not on file  Social Connections: Not on file  Intimate Partner Violence: Not on file    Review of Systems   General: Negative for anorexia, weight loss, fever, chills, fatigue, weakness. ENT: Negative for hoarseness, difficulty swallowing , nasal congestion. CV: Negative for chest pain, angina, palpitations, dyspnea on exertion, peripheral edema.  Respiratory: Negative for dyspnea at rest, dyspnea on exertion, cough, sputum, wheezing.  GI: See history of present illness. GU:  Negative for dysuria, hematuria, urinary incontinence, urinary frequency, nocturnal urination.  Endo: Negative for unusual weight change.     Physical Exam   There were no vitals taken for this visit.   General: Well-nourished, well-developed in no acute distress.  Eyes: No icterus. Mouth: Oropharyngeal mucosa moist and pink , no lesions erythema or exudate. Lungs: Clear to auscultation bilaterally.  Heart: Regular rate and rhythm, no murmurs rubs or gallops.  Abdomen: Bowel sounds are normal, nontender, nondistended, no hepatosplenomegaly or masses,  no abdominal bruits or hernia , no rebound or guarding.  Rectal: ***  Extremities: No lower extremity edema. No clubbing or deformities. Neuro: Alert and oriented x 4   Skin: Warm and dry, no jaundice.    Psych: Alert and cooperative, normal mood and affect.  Labs   *** Imaging Studies   No results found.  Assessment       PLAN   ***   Laureen Ochs. Bobby Rumpf, [place], [facility]

## 2022-09-14 ENCOUNTER — Ambulatory Visit: Payer: 59 | Admitting: Gastroenterology

## 2022-09-14 ENCOUNTER — Encounter: Payer: Self-pay | Admitting: Gastroenterology

## 2022-09-14 VITALS — BP 173/80 | HR 72 | Temp 98.0°F | Ht 63.5 in | Wt 128.6 lb

## 2022-09-14 DIAGNOSIS — K861 Other chronic pancreatitis: Secondary | ICD-10-CM

## 2022-09-14 DIAGNOSIS — R7989 Other specified abnormal findings of blood chemistry: Secondary | ICD-10-CM | POA: Diagnosis not present

## 2022-09-14 DIAGNOSIS — K7460 Unspecified cirrhosis of liver: Secondary | ICD-10-CM

## 2022-09-14 NOTE — Patient Instructions (Addendum)
We will plan for repeat labs in about six months. We will send you a reminder.  Continue Zenpep two with meals and one with snacks.  Talk with your PCP regarding abnormal sensations in your hands/feet.  You can try adding vitamin B12 1078mg daily.  Continue vitamin D supplement.  Return office visit in six months.  Please follow up with your PCP regarding elevated blood pressures.

## 2022-09-17 ENCOUNTER — Telehealth: Payer: Self-pay

## 2022-09-17 NOTE — Telephone Encounter (Signed)
Patient assistance for Zenpep was approved. Medication arrived today, pt was notified and will pick up on Monday, March 4th.

## 2022-11-15 ENCOUNTER — Other Ambulatory Visit: Payer: Self-pay

## 2022-11-15 DIAGNOSIS — R7989 Other specified abnormal findings of blood chemistry: Secondary | ICD-10-CM

## 2022-11-15 DIAGNOSIS — K7460 Unspecified cirrhosis of liver: Secondary | ICD-10-CM

## 2022-11-26 LAB — HEPATITIS B SURFACE ANTIBODY,QUALITATIVE: Hep B Surface Ab, Qual: REACTIVE

## 2022-11-26 LAB — HEPATIC FUNCTION PANEL
ALT: 49 IU/L — ABNORMAL HIGH (ref 0–32)
AST: 41 IU/L — ABNORMAL HIGH (ref 0–40)
Albumin: 4.5 g/dL (ref 3.9–4.9)
Alkaline Phosphatase: 167 IU/L — ABNORMAL HIGH (ref 44–121)
Bilirubin Total: 0.3 mg/dL (ref 0.0–1.2)
Bilirubin, Direct: <0.1 mg/dL (ref 0.00–0.40)
Total Protein: 7.3 g/dL (ref 6.0–8.5)

## 2022-11-26 LAB — MITOCHONDRIAL ANTIBODIES: Mitochondrial Ab: <20 Units (ref 0.0–20.0)

## 2022-11-26 LAB — HEPATITIS A ANTIBODY, TOTAL: hep A Total Ab: NEGATIVE

## 2023-03-01 ENCOUNTER — Encounter: Payer: Self-pay | Admitting: Gastroenterology

## 2023-03-01 ENCOUNTER — Ambulatory Visit: Payer: 59 | Admitting: Gastroenterology

## 2023-03-01 VITALS — BP 140/78 | HR 76 | Temp 98.1°F | Ht 63.5 in | Wt 124.2 lb

## 2023-03-01 DIAGNOSIS — K861 Other chronic pancreatitis: Secondary | ICD-10-CM

## 2023-03-01 DIAGNOSIS — K7460 Unspecified cirrhosis of liver: Secondary | ICD-10-CM

## 2023-03-01 NOTE — Patient Instructions (Signed)
Liver ultrasound to be scheduled at [facility]. You can complete labs at the same time.  Continue zenpep as before.  We will be in touch with results as available.

## 2023-03-01 NOTE — Progress Notes (Signed)
GI Office Note    Referring Provider: Antonietta Jewel Primary Care Physician:  Antonietta Jewel  Primary Gastroenterologist: Roetta Sessions, MD   Chief Complaint   Chief Complaint  Patient presents with   Follow-up    Pt says she is doing good    History of Present Illness   Hailey Greer is a 62 y.o. female presenting today for follow up. Last seen in 08/2022. She has h/o weight loss, greasy stools/diarrhea, remote acute hemorrhagic pancreatitis in 2014, concern for cirrhosis, chronically elevated LFTs. Previously declining MRI Abd/MRCP and colon cancer screening.  History of acute hemorrhagic pancreatitis complicated by acute respiratory failure and acute renal failure back in 2014. Triglycerides previously moderately elevated at 770 in 2015. Per records, moderate alcohol use at that time. Gallbladder removed in 2016. No problems since that time. Autoimmune pancreatitis work-up negative. Documented elevated alcohol level fall 2022 while in the ED at [facility]. She presented with intentional overdose on Xanax at that time. Elevated LFTs at that time.    Labs from April 2023, celiac screen negative.  Hepatitis B and C markers negative.  No iron overload.  LFTs better, alkaline phosphatase still abnormal at 247 but better, AST 45.   CT abdomen pelvis with contrast June 2023 with findings of chronic pancreatitis, findings suspicious for hepatic cirrhosis and possible portal venous hypertension. Mitochondrial antibodies negative, ANA negative, immunoglobulins unremarkable.  C. difficile negative.   No prior colonoscopy or EGD.   Labs from August 24, 2022: GGT 121, AFP 6, albumin 4.5, total bilirubin 0.2, alkaline phosphatase 193, AST 33, ALT 49, hemoglobin 14.4, platelets 244,000, INR 0.9.  AMA previously negative. MELD sodium 6.  Plan for labs in 3 months including LFTs, AMA, hepatitis B surface antibody, hepatitis A antibody.  Labs from 11/2022: AMA <20, Hep B  surf Ab positive, Hep A total Ab neg, Tbili 0.3, AP 167, AST 41, ALT49   Right upper quadrant ultrasound November 2023: Mild intrahepatic biliary ductal dilatation.  Common bile duct 4.6 mm.  Liver unremarkable.   Previously declined MRI abd/MRCP to evaluate chronically elevated alk phos.   Today: doing well. No GI issues. She has not completed Hep A vaccines but plans to do that. Weight is overall improved from one year ago, up 15 pounds.       Medications   Current Outpatient Medications  Medication Sig Dispense Refill   CALCIUM PO Take 1 tablet by mouth daily.     Cholecalciferol (VITAMIN D) 125 MCG (5000 UT) CAPS Take by mouth.     Magnesium 500 MG CAPS Take by mouth.     Multiple Vitamins-Minerals (CENTRUM SILVER PO) Take 1 tablet by mouth daily.     Omega-3 Fatty Acids (FISH OIL) 1000 MG CAPS Take by mouth.     Pancrelipase, Lip-Prot-Amyl, (ZENPEP) 40000-126000 units CPEP Take by mouth. Takes 2 capsules with meals and 1 with snacks.     Zinc 100 MG TABS Take by mouth.     No current facility-administered medications for this visit.    Allergies   Allergies as of 03/01/2023 - Review Complete 09/14/2022  Allergen Reaction Noted   Codeine  07/08/2014     Review of Systems   General: Negative for anorexia, weight loss, fever, chills, fatigue, weakness. ENT: Negative for hoarseness, difficulty swallowing , nasal congestion. CV: Negative for chest pain, angina, palpitations, dyspnea on exertion, peripheral edema.  Respiratory: Negative for dyspnea at rest, dyspnea on exertion, cough, sputum,  wheezing.  GI: See history of present illness. GU:  Negative for dysuria, hematuria, urinary incontinence, urinary frequency, nocturnal urination.  Endo: Negative for unusual weight change.     Physical Exam   BP (!) 140/78 (BP Location: Right Arm, Patient Position: Sitting, Cuff Size: Normal)   Pulse 76   Temp 98.1 F (36.7 C) (Temporal)   Ht 5' 3.5" (1.613 m)   Wt 124 lb 3.2  oz (56.3 kg)   SpO2 100%   BMI 21.66 kg/m    General: Well-nourished, well-developed in no acute distress.  Eyes: No icterus. Mouth: Oropharyngeal mucosa moist and pink   Lungs: Clear to auscultation bilaterally.  Heart: Regular rate and rhythm, no murmurs rubs or gallops.  Abdomen: Bowel sounds are normal, nontender, nondistended, no hepatosplenomegaly or masses,  no abdominal bruits or hernia , no rebound or guarding.  Rectal: not performed Extremities: No lower extremity edema. No clubbing or deformities. Neuro: Alert and oriented x 4   Skin: Warm and dry, no jaundice.   Psych: Alert and cooperative, normal mood and affect.  Labs   See hpi  Imaging Studies   No results found.  Assessment   *Chronic pancreatitis *Cirrhosis *Elevated alk phos  Clinically doing well. Due for hepatoma screening and updated labs. Previous AMA negative. GGT elevated. Previously declined MRI abd/MRCP. She is immune to hepatitis B.   The patient was found to have elevated blood pressure when vital signs were checked in the office. The blood pressure was rechecked by the nursing staff and it was found be persistently elevated >140/90 mmHg. I personally advised to the patient to follow up closely with his PCP for hypertension control.    PLAN   RUQ U/S Continue Zenpep. Update labs.  Recommend Hep A vaccinations.   [name]. Melvyn Neth, MHS, PA-C [facility]

## 2023-03-15 ENCOUNTER — Ambulatory Visit (HOSPITAL_COMMUNITY): Payer: 59

## 2023-03-23 ENCOUNTER — Ambulatory Visit (HOSPITAL_COMMUNITY)
Admission: RE | Admit: 2023-03-23 | Discharge: 2023-03-23 | Disposition: A | Discharge status: Home / Self Care | Payer: 59 | Source: Ambulatory Visit | Attending: Gastroenterology | Admitting: Gastroenterology

## 2023-03-23 DIAGNOSIS — K7460 Unspecified cirrhosis of liver: Secondary | ICD-10-CM | POA: Insufficient documentation

## 2023-03-23 DIAGNOSIS — K861 Other chronic pancreatitis: Secondary | ICD-10-CM | POA: Insufficient documentation

## 2023-03-23 DIAGNOSIS — Z9049 Acquired absence of other specified parts of digestive tract: Secondary | ICD-10-CM | POA: Diagnosis not present

## 2023-03-23 DIAGNOSIS — R932 Abnormal findings on diagnostic imaging of liver and biliary tract: Secondary | ICD-10-CM | POA: Diagnosis not present

## 2023-03-24 LAB — CBC WITH DIFFERENTIAL/PLATELET: MCHC: 32.4 g/dL (ref 31.5–35.7)

## 2023-03-29 ENCOUNTER — Other Ambulatory Visit: Payer: Self-pay

## 2023-03-29 DIAGNOSIS — K7460 Unspecified cirrhosis of liver: Secondary | ICD-10-CM

## 2023-03-29 DIAGNOSIS — R7989 Other specified abnormal findings of blood chemistry: Secondary | ICD-10-CM

## 2023-05-12 ENCOUNTER — Telehealth: Payer: Self-pay | Admitting: Gastroenterology

## 2023-05-12 NOTE — Telephone Encounter (Signed)
When would you like the pt to return for follow up?

## 2023-05-12 NOTE — Telephone Encounter (Signed)
Patient called to see when she was supposed to come back in and see Verlon Au.  I looked in the last office visit notes and didn't see anything.  Patient said she thought she was told to see Verlon Au every 3 months but now not sure.  Please advise.

## 2023-05-12 NOTE — Telephone Encounter (Signed)
Make an appt the week of 12/16 or after.

## 2023-05-12 NOTE — Telephone Encounter (Signed)
She is due labs in 06/2023. Let's have her return about 1 week after labs done (ie make dec appt and have her to get labs done one week before visit)

## 2023-06-13 ENCOUNTER — Other Ambulatory Visit: Payer: Self-pay

## 2023-06-13 DIAGNOSIS — R7989 Other specified abnormal findings of blood chemistry: Secondary | ICD-10-CM

## 2023-06-13 DIAGNOSIS — K7460 Unspecified cirrhosis of liver: Secondary | ICD-10-CM

## 2023-06-29 DIAGNOSIS — K7460 Unspecified cirrhosis of liver: Secondary | ICD-10-CM | POA: Diagnosis not present

## 2023-06-29 DIAGNOSIS — R7989 Other specified abnormal findings of blood chemistry: Secondary | ICD-10-CM | POA: Diagnosis not present

## 2023-07-08 ENCOUNTER — Encounter: Payer: Self-pay | Admitting: Gastroenterology

## 2023-07-08 ENCOUNTER — Ambulatory Visit: Payer: 59 | Admitting: Gastroenterology

## 2023-07-08 VITALS — BP 136/87 | HR 77 | Temp 97.6°F | Ht 63.5 in | Wt 126.4 lb

## 2023-07-08 DIAGNOSIS — R748 Abnormal levels of other serum enzymes: Secondary | ICD-10-CM | POA: Diagnosis not present

## 2023-07-08 DIAGNOSIS — K861 Other chronic pancreatitis: Secondary | ICD-10-CM

## 2023-07-08 DIAGNOSIS — K8681 Exocrine pancreatic insufficiency: Secondary | ICD-10-CM

## 2023-07-08 DIAGNOSIS — F1091 Alcohol use, unspecified, in remission: Secondary | ICD-10-CM

## 2023-07-08 DIAGNOSIS — R7989 Other specified abnormal findings of blood chemistry: Secondary | ICD-10-CM

## 2023-07-08 DIAGNOSIS — K7460 Unspecified cirrhosis of liver: Secondary | ICD-10-CM | POA: Diagnosis not present

## 2023-07-08 NOTE — Patient Instructions (Signed)
We will be in touch next week with remaining lab results.  Continue to try to get adequate protein in your diet to maintain muscle mass.   Nutrition: High protien diet (80 grams/day) from a primarily plant based diet. Avoid red meat. No raw or undercooked meat, seafood, or shellfish. Low fat/cholesterol/carbohydrate diet. Limit sodium to 2000 mg/day. Recommend a daily multivitamin. Recommend 30 minutes of aerobic and resistance exercise three days/week.  DIET/NUTRITION FOR CIRRHOSIS NO ALCOHOL  YOUR GOALS o PROTIEN INTAKE 1.2 g/kg/day (if malnourished 1.5 g/kg/day) -  - 70 GRAMS  o CALORIES 30-35 cal/kg/day -  - 2000 calories/day  Evening snack - high protein Supplements between meals to help meet calorie and protein goal: Boost Ensure Premier Protein Shakes Protein: Greek yogurt Fish, chicken (NO RAW OR UNDERCOOKED FISH/SHELLFISH) Avoid pork and red meat Plant based protein (non-soy)/Vegan: Lentils, Chickpeas, Peanuts (non salted), almonds (non salted), quinoa, chia seeds  Plant based protein supplements (not soy)  Avoid/limit animal based protein supplements: whey, casein Low sodium (2,000 mg/day) Avoid: table salt, canned foods, deli meats, sausages, hot dogs, anything with a long shelf life Read nutrition labels and be aware of serving size. Don't go by percent of daily value. Supplements: multivitamin Zinc   PHYSICAL ACTIVITY It is important to continue to be active when you have cirrhosis. Exercise will help reduce muscle loss and weakness.  Exercise ? Aerobic 40 minutes, 3 times/week - Anything that raises the heart rate and makes it difficult to hold a prolonged conversation because of breathing  - Brisk walk - Swimming - Biking  - Swinging your arms or clapping your hands ? Resistance 45 minutes, 3 times/week - Weights - Resistance bands - You can do this from a chair with little to no impact on joints (information attached)   10 Chair Exercises  1  Ankle  and Wrist Rolls   Sit tall on a sturdy chair, so your back is straight and is not leaning against the chair back.   Flex your fingers, opening and closing your fists several times before making fists and rolling your wrists 10 times in each direction.  Perform the same exercises with your feet. First, flex and point each foot independently as you simultaneously curl and straighten your toes.  One at a time, roll each ankle to the outside 10 times, then one at a time, roll each ankle to the inside 10 times.  2  Single-Leg Calf Raises   Sitting tall in a chair with feet planted flat on the floor about hip-distance apart, engage your core and look straight ahead.   Start with the right foot and lift your heel from the ground as high as you can, trying to raise up as high as you can on your toes, engaging the calf as you perform the exercise. Lower the heel back to the floor and repeat to complete a set of 10 repetitions.  Repeat the movement with the left leg.   Perform three sets of 10 reps per leg.  After performing the initial sets, add two more sets of 10 repetitions, this time lifting both heels simultaneously. At the end of the last set, hold the heels lifted from the floor for 20 seconds.   3 Sit-and-Stands  The sit-and-stand is just what it sounds like.  Start seated in a sturdy chair, feet planted on the floor about hip-distance apart.  Using as little assistance from hands or arms as possible, engage your core, and tip forward from the  hips.   Press your weight through all four corners of your feet and push yourself to stand, extending your knees and hips fully.   Reverse the movement, pressing your hips back and bending your knees to carefully lower yourself to the seated position.  If you can't press all the way to a standing position, simply shift your weight forward and lift your glutes an inch or two from the chair seat and hold for a second before lowering back down. Over time, work  on developing the strength and balance necessary to come to a standing position.  4  Seated Hip Marches   Sit tall on a sturdy chair, your feet flat on the floor, hip-distance apart.   Grasp the edges or armrests of the chair with both hands and engage your abdominal muscles to help keep your torso tall.   LIft your right leg with your knee bent as high as you comfortably can, as though doing a high-knee march.   Lower your right foot to the floor with control.  Repeat to the opposite side. Perform at least 20 alternating marches in succession. Take a break, then repeat two to three more times. This exercise can be continued for a more cardiovascular effect, or it can be incorporated into a warm-up to help raise the heart rate and get the blood flowing before performing more strength-focused movements.  5  Heel Slides   Sit tall in a sturdy chair, with knees bent and feet flat on the floor about hip-distance apart.   Extend the right leg and flex the right foot, so the heel remains in contact with the ground, but the toes are pointing up toward the ceiling.   Engage your glutes and hamstrings, using these muscle groups to drag your right heel back toward the chair while it remains in contact with the floor.   Reverse the movement and slide your heel away from you, extending your right knee. Perform 10 to 12 repetitions on one side before switching legs.   Complete two to three sets per leg.  While this exercise can be done without any special equipment, you may want to use a paper plate or a small towel to make it easier for the heel to slide across the floor.  6  Seated Shoulder Press   Use lightweight dumbbells, water bottles, canned goods, or resistance bands to perform this exercise. If you're using a resistance band, select a long, flat band and secure it in place by sitting on top of the center of the band before grasping each end to perform the exercise.   Sit tall in a sturdy chair,  your feet flat on the ground about shoulder-distance apart.   Hold a light dumbbell or the end of a resistance band in each hand at your shoulders, your elbows bent and your palms facing away from you.   Press your arms straight up overhead, extending your elbows.   Carefully lower your hands back to the starting position.   Complete two to three sets of 10 to 12 repetitions.  7  Seated Torso Twists    Sit tall, your feet flat on the ground about hip-distance apart. Make sure you don't lean back in the chair.   Place your hands lightly behind your head, your elbows bent and pointing out toward the sides of the room.  Keeping your pelvis steady, exhale and twist your torso to the right as far as you comfortably can.   Inhale and  return to center, keeping your hips stable.   Exhale and twist your torso to the left as far as you comfortably can.   Inhale and return to center.   Continue until you've twisted to each side between six and eight times. Rest, then perform a second set.  8  Modified Leg Lifts  While it's best to use a sturdy chair with armrests for this move, you can also perform the exercise while gripping the edges of the chair beside your hips.   Sit tall in a chair, your core engaged, your feet together and flat on the floor. Roll your shoulders back to maintain perfect posture.  Hold the chair's armrests or grip the chair's seat. Keeping your feet and knees together, lift both legs as high as you can (with knees bent) as you exhale.   Hold for five seconds, then lower your feet back to the floor.   Perform 10 to 12 repetitions and complete a total of three to five sets.  9  Modified Planks  Position the chair in front of a wall so the chair is stable and won't slide or move as you're performing the plank. You can position the chair so the seat is facing the wall, providing you with access to the back of the chair for support, or you can position the chair so the back is facing  the wall, providing you with access to the seat of the chair for support. Adults with lower levels of strength or mobility should start by using the back of the chair for support.   Once the chair is secure against the wall, place your hands on the back of the chair (or on the seat, depending on the chair's position) so your hands are shoulder-distance apart.   Engage your core and step your feet backward until your body forms a straight diagonal line from your heels to your head. Your arms should be perfectly straight, your hips should be perfectly aligned between your knees and your shoulders, and you should feel your abdominals working to keep your body steady.   Hold the position for 10 to 60 seconds before returning to standing.   Complete three sets, holding each plank for as long as you can while maintaining good form.  10  Modified Burpees   Push a sturdy chair against a wall so the back is to the wall and the chair isn't at risk of sliding or moving.   Stand facing the chair, feet roughly shoulder-distance apart.   Press your hips back and bend your knees to enter a half-squat position.   Place both hands firmly on the chair's seat, arms fully extended and palms aligned under the shoulders.  Step one foot, then the other, behind you, so your body forms a straight line from heels to head in a modified chair plank position.   Reverse the movement and step each foot forward to their starting position.   Press through your feet and extend your knees and hips as you rise to standing. As you do, lift your arms over your head, clapping your hands together.   This counts as a single modified chair burpee. Perform as many as you can (aim for six to 10) with perfect form. Complete two to three sets.  Source: [employer], [employer], Proctor DN, Curley Spice MA, [place], [place], [place], Skinner JS. "[employer] Position Stand. Exercise and  Physical Activity for Older  Adults." Medicine and Science in Sports and Exercise. https://watson.com/. July 2009.

## 2023-07-08 NOTE — Progress Notes (Signed)
GI Office Note    Referring Provider: Antonietta Jewel Primary Care Physician:  Antonietta Jewel  Primary Gastroenterologist: Roetta Sessions, MD   Chief Complaint   Chief Complaint  Patient presents with   Follow-up    Follow up on LFT's and hepatic cirrhosis    History of Present Illness   Hailey Greer is a 63 y.o. female presenting today for follow up. Last seen in 02/2023. She has h/o chronic pancreatitis, cirrhosis, elevated AP. Previously declining MRI Abd/MRCP and colon cancer screening.   Today: Overall doing ok. No abdominal pain. No n/v. Appetite ok but notes that she usually does more snaking than meals. Does eat veggies. Likes sweets. Bms regular. No melena, brbpr. No lower extremity edema. Takes Valerian root prn insomnia.  No etoh in two years.   Patient with positive Hep B core total ab and Hep B surface ab. She denies personally history of Hep B. Her husband reports having Hep B prior to their marriage. She states he was a teenager when he got sick. States he was told that the Hep B resolved but then came back. She is not sure of all the details. Says she was checked once and told she was negative. She was treated with one shot of Hep B per her report, around the time her husband was told he had Hep B again.   Since her last ov, she completed RUQ U/S 03/2023. No focal liver lesion. Mildly increased hepatic parenchymal echogenicity s/o steatosis. CBD 5.8. Labs Hgb 13.9, platelets 265, Hep B core total ab positive. Hep B surf Ab previously reactive. Tbili 0.3, AP 159H, AST 28, ALT 34H, INR 1, sodium 138, BUN 8, cre 0.78, AFP  7.4.   MELD 3.0 of 7.   Additional labs earlier this month with WBC 12.9 (stable compared to 3 months prior), Hgb 14.9, platelets 308, Cre 0.78, Na 137, alb 4.5, Tbili 0.3, AP 173H, AST 40, ALT 46H, INR 1. AP isoenzymres pending.    Prior Data:  History of acute hemorrhagic pancreatitis complicated by acute respiratory failure and  acute renal failure back in 2014. Triglycerides previously moderately elevated at 770 in 2015. Per records, moderate alcohol use at that time. Gallbladder removed in 2016. No problems since that time. Autoimmune pancreatitis work-up negative. Documented elevated alcohol level fall 2022 while in the ED at [facility]. She presented with intentional overdose on Xanax at that time. Elevated LFTs at that time.   Labs from April 2023, celiac screen negative.  Hepatitis B surf antigen and C markers negative.  No iron overload.  LFTs better, alkaline phosphatase still abnormal at 247 but better, AST 45.   CT abdomen pelvis with contrast June 2023 with findings of chronic pancreatitis, findings suspicious for hepatic cirrhosis and possible portal venous hypertension. Mitochondrial antibodies negative, ANA negative, immunoglobulins unremarkable.  C. difficile negative.   No prior colonoscopy or EGD.   Labs from August 24, 2022: GGT 121, AFP 6, albumin 4.5, total bilirubin 0.2, alkaline phosphatase 193, AST 33, ALT 49, hemoglobin 14.4, platelets 244,000, INR 0.9.  AMA previously negative. MELD sodium 6.  Plan for labs in 3 months including LFTs, AMA, hepatitis B surface antibody, hepatitis A antibody.   Labs from 11/2022: AMA <20, Hep B surf Ab positive, Hep A total Ab neg, Tbili 0.3, AP 167, AST 41, ALT49   Right upper quadrant ultrasound November 2023: Mild intrahepatic biliary ductal dilatation.  Common bile duct 4.6 mm.  Liver unremarkable.   Previously declined MRI abd/MRCP to evaluate chronically elevated alk phos.   Wt Readings from Last 3 Encounters:  07/08/23 126 lb 6.4 oz (57.3 kg)  03/01/23 124 lb 3.2 oz (56.3 kg)  09/14/22 128 lb 9.6 oz (58.3 kg)      Medications   Current Outpatient Medications  Medication Sig Dispense Refill   CALCIUM PO Take 1 tablet by mouth daily.     Cholecalciferol (VITAMIN D) 125 MCG (5000 UT) CAPS Take by mouth.     Magnesium 500 MG CAPS Take by mouth.      Multiple Vitamins-Minerals (CENTRUM SILVER PO) Take 1 tablet by mouth daily.     Omega-3 Fatty Acids (FISH OIL) 1000 MG CAPS Take by mouth.     Pancrelipase, Lip-Prot-Amyl, (ZENPEP) 40000-126000 units CPEP Take by mouth. Takes 2 capsules with meals and 1 with snacks.     vitamin E 1000 UNIT capsule Take 1,000 Units by mouth daily.     Zinc 100 MG TABS Take by mouth.     No current facility-administered medications for this visit.    Allergies   Allergies as of 07/08/2023 - Review Complete 07/08/2023  Allergen Reaction Noted   Codeine  07/08/2014            Review of Systems   General: Negative for anorexia, weight loss, fever, chills, fatigue, weakness. ENT: Negative for hoarseness, difficulty swallowing , nasal congestion. CV: Negative for chest pain, angina, palpitations, dyspnea on exertion, peripheral edema.  Respiratory: Negative for dyspnea at rest, dyspnea on exertion, cough, sputum, wheezing.  GI: See history of present illness. GU:  Negative for dysuria, hematuria, urinary incontinence, urinary frequency, nocturnal urination.  Endo: Negative for unusual weight change.     Physical Exam   BP 136/87   Pulse 77   Temp 97.6 F (36.4 C)   Ht 5' 3.5" (1.613 m)   Wt 126 lb 6.4 oz (57.3 kg)   BMI 22.04 kg/m    General:thin, female in no acute distress.  Eyes: No icterus. Mouth: Oropharyngeal mucosa moist and pink  . Lungs: Clear to auscultation bilaterally.  Heart: Regular rate and rhythm, no murmurs rubs or gallops.  Abdomen: Bowel sounds are normal, nontender, nondistended, no hepatosplenomegaly or masses,  no abdominal bruits or hernia , no rebound or guarding. Liver edge palpable with deep inspiration in the right lateral upper quadrant, nontender Rectal: not performed  Extremities: No lower extremity edema. No clubbing or deformities. Neuro: Alert and oriented x 4   Skin: Warm and dry, no jaundice.   Psych: Alert and cooperative, normal mood and  affect.  Labs   See hpi  Imaging Studies   No results found.  Assessment/Plan:   Chronic pancreatitis -continue pancreatitis enzymes with meals and snacks -continue etoh cessation  Cirrhosis/elevated ALT/AP: -AP remains elevated. GGT previously elevated, AMA negative. Patient declined MRI/MRCP previously. AMA negative PBC remains possibility. May need PBC profile to get additional PBC markers for completeness. -await AP isoenzyme results   -MELD 3.0 of 7. Indicating preserved liver function.  -hepatoma screening up to date, due again in 09/2023.  -continue etoh cessation -adequate dietary protein discussed today, goal of 70g/daily based on weights -recommend exercise, handout provided -return ov in six months    [name]. Melvyn Neth, MHS, PA-C [facility]

## 2023-07-10 LAB — PROTIME-INR
INR: 1 (ref 0.9–1.2)
Prothrombin Time: 11.1 s (ref 9.1–12.0)

## 2023-07-10 LAB — COMPREHENSIVE METABOLIC PANEL
ALT: 46 [IU]/L — ABNORMAL HIGH (ref 0–32)
AST: 40 [IU]/L (ref 0–40)
Albumin: 4.5 g/dL (ref 3.9–4.9)
Alkaline Phosphatase: 173 [IU]/L — ABNORMAL HIGH (ref 44–121)
BUN/Creatinine Ratio: 8 — ABNORMAL LOW (ref 12–28)
BUN: 6 mg/dL — ABNORMAL LOW (ref 8–27)
Bilirubin Total: 0.3 mg/dL (ref 0.0–1.2)
CO2: 20 mmol/L (ref 20–29)
Calcium: 10.1 mg/dL (ref 8.7–10.3)
Chloride: 99 mmol/L (ref 96–106)
Creatinine, Ser: 0.78 mg/dL (ref 0.57–1.00)
Globulin, Total: 3 g/dL (ref 1.5–4.5)
Glucose: 88 mg/dL (ref 70–99)
Potassium: 6.1 mmol/L — ABNORMAL HIGH (ref 3.5–5.2)
Sodium: 137 mmol/L (ref 134–144)
Total Protein: 7.5 g/dL (ref 6.0–8.5)
eGFR: 85 mL/min/{1.73_m2} (ref >59)

## 2023-07-10 LAB — CBC WITH DIFFERENTIAL/PLATELET
Basophils Absolute: 0.1 10*3/uL (ref 0.0–0.2)
Basos: 1 %
EOS (ABSOLUTE): 0.2 10*3/uL (ref 0.0–0.4)
Eos: 2 %
Hematocrit: 44.5 % (ref 34.0–46.6)
Hemoglobin: 14.9 g/dL (ref 11.1–15.9)
Immature Grans (Abs): 0 10*3/uL (ref 0.0–0.1)
Immature Granulocytes: 0 %
Lymphocytes Absolute: 4.5 10*3/uL — ABNORMAL HIGH (ref 0.7–3.1)
Lymphs: 35 %
MCH: 31.8 pg (ref 26.6–33.0)
MCHC: 33.5 g/dL (ref 31.5–35.7)
MCV: 95 fL (ref 79–97)
Monocytes Absolute: 0.8 10*3/uL (ref 0.1–0.9)
Monocytes: 6 %
Neutrophils Absolute: 7.3 10*3/uL — ABNORMAL HIGH (ref 1.4–7.0)
Neutrophils: 56 %
Platelets: 308 10*3/uL (ref 150–450)
RBC: 4.68 x10E6/uL (ref 3.77–5.28)
RDW: 12.7 % (ref 11.7–15.4)
WBC: 12.9 10*3/uL — ABNORMAL HIGH (ref 3.4–10.8)

## 2023-07-10 LAB — ALKALINE PHOSPHATASE, ISOENZYMES
BONE FRACTION: 44 % (ref 14–68)
INTESTINAL FRAC.: 0 % (ref 0–18)
LIVER FRACTION: 56 % (ref 18–85)

## 2023-07-19 ENCOUNTER — Other Ambulatory Visit: Payer: Self-pay

## 2023-07-19 DIAGNOSIS — K861 Other chronic pancreatitis: Secondary | ICD-10-CM

## 2023-07-19 DIAGNOSIS — R7989 Other specified abnormal findings of blood chemistry: Secondary | ICD-10-CM

## 2023-07-19 DIAGNOSIS — K7460 Unspecified cirrhosis of liver: Secondary | ICD-10-CM

## 2023-08-30 ENCOUNTER — Encounter: Payer: Self-pay | Admitting: Gastroenterology

## 2023-09-06 ENCOUNTER — Telehealth: Payer: Self-pay | Admitting: Gastroenterology

## 2023-09-06 DIAGNOSIS — K7460 Unspecified cirrhosis of liver: Secondary | ICD-10-CM

## 2023-09-06 NOTE — Telephone Encounter (Signed)
 [place] scheduled for 3/10, arrival 8:15am, npo midnight  Called pt and she is aware of appt details

## 2023-09-06 NOTE — Telephone Encounter (Signed)
 Pt received letter to schedule her U/S. Please call [phone]

## 2023-09-13 ENCOUNTER — Other Ambulatory Visit: Payer: Self-pay

## 2023-09-13 DIAGNOSIS — R7989 Other specified abnormal findings of blood chemistry: Secondary | ICD-10-CM

## 2023-09-13 DIAGNOSIS — K861 Other chronic pancreatitis: Secondary | ICD-10-CM

## 2023-09-13 DIAGNOSIS — K7460 Unspecified cirrhosis of liver: Secondary | ICD-10-CM

## 2023-09-24 LAB — CBC WITH DIFFERENTIAL/PLATELET
Basophils Absolute: 0.1 10*3/uL (ref 0.0–0.2)
Basos: 1 %
EOS (ABSOLUTE): 0.2 10*3/uL (ref 0.0–0.4)
Eos: 2 %
Hematocrit: 41.4 % (ref 34.0–46.6)
Hemoglobin: 13.3 g/dL (ref 11.1–15.9)
Immature Grans (Abs): 0 10*3/uL (ref 0.0–0.1)
Immature Granulocytes: 0 %
Lymphocytes Absolute: 4.5 10*3/uL — ABNORMAL HIGH (ref 0.7–3.1)
Lymphs: 36 %
MCH: 30.4 pg (ref 26.6–33.0)
MCHC: 32.1 g/dL (ref 31.5–35.7)
MCV: 95 fL (ref 79–97)
Monocytes Absolute: 0.8 10*3/uL (ref 0.1–0.9)
Monocytes: 6 %
Neutrophils Absolute: 7 10*3/uL (ref 1.4–7.0)
Neutrophils: 55 %
Platelets: 253 10*3/uL (ref 150–450)
RBC: 4.38 x10E6/uL (ref 3.77–5.28)
RDW: 13 % (ref 11.7–15.4)
WBC: 12.5 10*3/uL — ABNORMAL HIGH (ref 3.4–10.8)

## 2023-09-24 LAB — AFP TUMOR MARKER: AFP, Serum, Tumor Marker: 6.4 ng/mL (ref 0.0–9.2)

## 2023-09-24 LAB — PROTIME-INR
INR: 1 (ref 0.9–1.2)
Prothrombin Time: 10.9 s (ref 9.1–12.0)

## 2023-09-24 LAB — COMPREHENSIVE METABOLIC PANEL
ALT: 28 IU/L (ref 0–32)
AST: 26 IU/L (ref 0–40)
Albumin: 4.5 g/dL (ref 3.9–4.9)
Alkaline Phosphatase: 127 IU/L — ABNORMAL HIGH (ref 44–121)
BUN/Creatinine Ratio: 10 — ABNORMAL LOW (ref 12–28)
BUN: 9 mg/dL (ref 8–27)
Bilirubin Total: 0.3 mg/dL (ref 0.0–1.2)
CO2: 22 mmol/L (ref 20–29)
Calcium: 9.6 mg/dL (ref 8.7–10.3)
Chloride: 102 mmol/L (ref 96–106)
Creatinine, Ser: 0.88 mg/dL (ref 0.57–1.00)
Globulin, Total: 2.9 g/dL (ref 1.5–4.5)
Glucose: 118 mg/dL — ABNORMAL HIGH (ref 70–99)
Potassium: 4.8 mmol/L (ref 3.5–5.2)
Sodium: 139 mmol/L (ref 134–144)
Total Protein: 7.4 g/dL (ref 6.0–8.5)
eGFR: 73 mL/min/{1.73_m2} (ref >59)

## 2023-09-24 LAB — ANA TITER AND PATTERN: Speckled Pattern: 1:40 {titer} — ABNORMAL HIGH

## 2023-09-24 LAB — PRIMARY BILIARY CHOLANGITIS PR
Anti-GP-210 Ab (RDL): <20 U (ref <20)
Anti-Mitochondrial Ab by IFA: <1:20 {titer}
Anti-Mitochondrial M2 Ab (RDL): <20 U (ref <20)
Anti-Nuclear Ab by IFA (RDL): POSITIVE — AB
Anti-SP-100 Ab (RDL): <20 U (ref <20)
Anti-Smooth Muscle Ab by IFA: 1:40 {titer} — ABNORMAL HIGH

## 2023-09-26 ENCOUNTER — Ambulatory Visit (HOSPITAL_COMMUNITY)
Admission: RE | Admit: 2023-09-26 | Discharge: 2023-09-26 | Disposition: A | Discharge status: Home / Self Care | Payer: 59 | Source: Ambulatory Visit | Attending: Gastroenterology | Admitting: Gastroenterology

## 2023-09-26 DIAGNOSIS — K7460 Unspecified cirrhosis of liver: Secondary | ICD-10-CM | POA: Diagnosis present

## 2023-10-10 ENCOUNTER — Other Ambulatory Visit: Payer: Self-pay

## 2023-10-10 DIAGNOSIS — K7460 Unspecified cirrhosis of liver: Secondary | ICD-10-CM

## 2023-11-21 ENCOUNTER — Encounter: Payer: Self-pay | Admitting: Gastroenterology

## 2023-11-21 ENCOUNTER — Encounter: Payer: Self-pay | Admitting: Internal Medicine

## 2023-12-26 ENCOUNTER — Other Ambulatory Visit: Payer: Self-pay

## 2023-12-26 DIAGNOSIS — K7460 Unspecified cirrhosis of liver: Secondary | ICD-10-CM

## 2024-01-12 LAB — HEPATIC FUNCTION PANEL
ALT: 25 IU/L (ref 0–32)
AST: 25 IU/L (ref 0–40)
Albumin: 4.5 g/dL (ref 3.9–4.9)
Alkaline Phosphatase: 103 IU/L (ref 44–121)
Bilirubin Total: 0.3 mg/dL (ref 0.0–1.2)
Bilirubin, Direct: 0.11 mg/dL (ref 0.00–0.40)
Total Protein: 7.2 g/dL (ref 6.0–8.5)

## 2024-01-16 NOTE — Progress Notes (Unsigned)
 GI Office Note    Referring Provider: Vida Mardy VEAR DEVONNA Primary Care Physician:  Vida Mardy VEAR DEVONNA  Primary Gastroenterologist: Ozell Hollingshead, MD   Chief Complaint   No chief complaint on file.   History of Present Illness   Hailey Greer is a 63 y.o. female presenting today for follow up. Last seen 06/2023. She has h/o chronic pancreatitis, cirrhosis, elevated AP. Previously declining MRI Abd/MRCP and colon cancer screening.     Labs 09/2023:  RUQ U/S 09/2023 IMPRESSION: 1. Status post cholecystectomy. 2. Mild increased hepatic parenchymal echogenicity suggestive of steatosis. Prior Data:   History of acute hemorrhagic pancreatitis complicated by acute respiratory failure and acute renal failure back in 2014. Triglycerides previously moderately elevated at 770 in 2015. Per records, moderate alcohol use at that time. Gallbladder removed in 2016. No problems since that time. Autoimmune pancreatitis work-up negative. Documented elevated alcohol level fall 2022 while in the ED at [facility]. She presented with intentional overdose on Xanax  at that time. Elevated LFTs at that time.    Labs from April 2023, celiac screen negative.  Hepatitis B surf antigen and C markers negative.  No iron overload.  LFTs better, alkaline phosphatase still abnormal at 247 but better, AST 45.   CT abdomen pelvis with contrast June 2023 with findings of chronic pancreatitis, findings suspicious for hepatic cirrhosis and possible portal venous hypertension. Mitochondrial antibodies negative, ANA negative, immunoglobulins unremarkable.  C. difficile negative.   No prior colonoscopy or EGD.   Labs from August 24, 2022: GGT 121, AFP 6, albumin 4.5, total bilirubin 0.2, alkaline phosphatase 193, AST 33, ALT 49, hemoglobin 14.4, platelets 244,000, INR 0.9.  AMA previously negative. MELD sodium 6.  Plan for labs in 3 months including LFTs, AMA, hepatitis B surface antibody, hepatitis A  antibody.   Labs from 11/2022: AMA <20, Hep B surf Ab positive, Hep A total Ab neg, Tbili 0.3, AP 167, AST 41, ALT49   Right upper quadrant ultrasound November 2023: Mild intrahepatic biliary ductal dilatation.  Common bile duct 4.6 mm.  Liver unremarkable.   Previously declined MRI abd/MRCP to evaluate chronically elevated alk phos.         Medications   Current Outpatient Medications  Medication Sig Dispense Refill   CALCIUM PO Take 1 tablet by mouth daily.     Cholecalciferol (VITAMIN D) 125 MCG (5000 UT) CAPS Take by mouth.     Magnesium 500 MG CAPS Take by mouth.     Multiple Vitamins-Minerals (CENTRUM SILVER PO) Take 1 tablet by mouth daily.     Omega-3 Fatty Acids (FISH OIL) 1000 MG CAPS Take by mouth.     Pancrelipase , Lip-Prot-Amyl, (ZENPEP ) 40000-126000 units CPEP Take by mouth. Takes 2 capsules with meals and 1 with snacks.     vitamin E 1000 UNIT capsule Take 1,000 Units by mouth daily.     Zinc 100 MG TABS Take by mouth.     No current facility-administered medications for this visit.    Allergies   Allergies as of 01/17/2024 - Review Complete 07/08/2023  Allergen Reaction Noted   Codeine  07/08/2014     Past Medical History   Past Medical History:  Diagnosis Date   Anxiety    COPD (chronic obstructive pulmonary disease) (HCC)    Depression    h/o suicide attempty with xanax  in 03/2021   H/O ETOH abuse    Hypertension    Nausea & vomiting 07/08/2014   Pancreatitis  Past Surgical History   Past Surgical History:  Procedure Laterality Date   CHOLECYSTECTOMY N/A 07/24/2014   Procedure: LAPAROSCOPIC CHOLECYSTECTOMY;  Surgeon: Oneil DELENA Budge, MD;  Location: [facility];  Service: General;  Laterality: N/A;   TUBAL LIGATION      Past Family History   Family History  Problem Relation Age of Onset   Cancer Mother        liver   Pancreatic cancer Maternal Grandfather    Celiac disease Neg Hx    Colon cancer Neg Hx    Inflammatory bowel disease Neg Hx      Past Social History   Social History   Socioeconomic History   Marital status: Married    Spouse name: Not on file   Number of children: Not on file   Years of education: Not on file   Highest education level: Not on file  Occupational History   Not on file  Tobacco Use   Smoking status: Every Day    Current packs/day: 0.50    Average packs/day: 0.5 packs/day for 20.0 years (10.0 ttl pk-yrs)    Types: Cigarettes   Smokeless tobacco: Never  Substance and Sexual Activity   Alcohol use: No    Comment: h/o etoh abuse. per PCP 08/2021 note she was drinking one mixed drink per day. No etoh in one year per pt (03/16/22).  Patient states that she never drank heavily but did drink daily, usually 2 wine coolers. no etoh per 09/14/22   Drug use: No   Sexual activity: Yes  Other Topics Concern   Not on file  Social History Narrative   Not on file   Social Drivers of Health   Financial Resource Strain: Not on file  Food Insecurity: Not on file  Transportation Needs: Not on file  Physical Activity: Not on file  Stress: Not on file  Social Connections: Not on file  Intimate Partner Violence: Not on file    Review of Systems   General: Negative for anorexia, weight loss, fever, chills, fatigue, weakness. ENT: Negative for hoarseness, difficulty swallowing , nasal congestion. CV: Negative for chest pain, angina, palpitations, dyspnea on exertion, peripheral edema.  Respiratory: Negative for dyspnea at rest, dyspnea on exertion, cough, sputum, wheezing.  GI: See history of present illness. GU:  Negative for dysuria, hematuria, urinary incontinence, urinary frequency, nocturnal urination.  Endo: Negative for unusual weight change.     Physical Exam   There were no vitals taken for this visit.   General: Well-nourished, well-developed in no acute distress.  Eyes: No icterus. Mouth: Oropharyngeal mucosa moist and pink   Lungs: Clear to auscultation bilaterally.  Heart: Regular  rate and rhythm, no murmurs rubs or gallops.  Abdomen: Bowel sounds are normal, nontender, nondistended, no hepatosplenomegaly or masses,  no abdominal bruits or hernia , no rebound or guarding.  Rectal: not performed Extremities: No lower extremity edema. No clubbing or deformities. Neuro: Alert and oriented x 4   Skin: Warm and dry, no jaundice.   Psych: Alert and cooperative, normal mood and affect.  Labs   *** Imaging Studies   No results found.  Assessment/Plan:        PIERRETTE Sonny RAMAN. Ezzard, MHS, PA-C [facility]

## 2024-01-17 ENCOUNTER — Encounter: Payer: Self-pay | Admitting: Gastroenterology

## 2024-01-17 ENCOUNTER — Ambulatory Visit: Admitting: Gastroenterology

## 2024-01-17 ENCOUNTER — Telehealth: Payer: Self-pay

## 2024-01-17 ENCOUNTER — Telehealth: Payer: Self-pay | Admitting: Gastroenterology

## 2024-01-17 VITALS — BP 144/86 | HR 76 | Temp 98.6°F | Ht 63.5 in | Wt 123.0 lb

## 2024-01-17 DIAGNOSIS — K861 Other chronic pancreatitis: Secondary | ICD-10-CM

## 2024-01-17 DIAGNOSIS — R7989 Other specified abnormal findings of blood chemistry: Secondary | ICD-10-CM

## 2024-01-17 DIAGNOSIS — K8681 Exocrine pancreatic insufficiency: Secondary | ICD-10-CM

## 2024-01-17 DIAGNOSIS — K7460 Unspecified cirrhosis of liver: Secondary | ICD-10-CM

## 2024-01-17 NOTE — Patient Instructions (Signed)
 We will reach out closer to 03/2024 to have your labs and liver ultrasound updated.   We will look into Zenpep  patient assistance. Samples to be provided today.   Try to get in at least 120 grams of protein daily. See handout for protein sources.   Return office visit in six months.

## 2024-01-17 NOTE — Telephone Encounter (Signed)
 Contacted [employer] regarding refills on patient's Zenpep , was told the shipment would arrive here at the office in 3 to 4 business days.

## 2024-01-17 NOTE — Telephone Encounter (Signed)
 Please change ruq u/s for 03/2024, to abd u/s with elastography. Dx: elevated lfts, possible cirrhosis, hepatoma screening

## 2024-01-18 NOTE — Telephone Encounter (Signed)
 Thank you. Will it be 3 months or 6 months?

## 2024-01-18 NOTE — Telephone Encounter (Signed)
 noted

## 2024-01-19 ENCOUNTER — Ambulatory Visit: Payer: Self-pay | Admitting: Gastroenterology

## 2024-01-19 ENCOUNTER — Encounter: Payer: Self-pay | Admitting: Internal Medicine

## 2024-01-24 NOTE — Telephone Encounter (Signed)
 Pt's Zenpe[employer]  was delivered on 01/23/2024. A 3 mth supply was delivered. Pt was made aware and stated that she will pick up from office on Thursday 01/26/2024.

## 2024-01-26 NOTE — Telephone Encounter (Signed)
 thanks

## 2024-02-03 ENCOUNTER — Other Ambulatory Visit: Payer: Self-pay | Admitting: *Deleted

## 2024-02-03 ENCOUNTER — Encounter: Payer: Self-pay | Admitting: *Deleted

## 2024-02-03 DIAGNOSIS — R7989 Other specified abnormal findings of blood chemistry: Secondary | ICD-10-CM

## 2024-02-03 DIAGNOSIS — K7460 Unspecified cirrhosis of liver: Secondary | ICD-10-CM

## 2024-02-03 NOTE — Telephone Encounter (Signed)
 Pt informed US  is scheduled for Wednesday 03/28/24, arrive at 9:15 am to check in, NPO after midnight.

## 2024-02-20 ENCOUNTER — Encounter: Payer: Self-pay | Admitting: Gastroenterology

## 2024-03-05 ENCOUNTER — Other Ambulatory Visit: Payer: Self-pay

## 2024-03-05 DIAGNOSIS — R7989 Other specified abnormal findings of blood chemistry: Secondary | ICD-10-CM

## 2024-03-05 DIAGNOSIS — K7460 Unspecified cirrhosis of liver: Secondary | ICD-10-CM

## 2024-03-28 ENCOUNTER — Ambulatory Visit (HOSPITAL_COMMUNITY)
Admission: RE | Admit: 2024-03-28 | Discharge: 2024-03-28 | Disposition: A | Discharge status: Home / Self Care | Source: Ambulatory Visit | Attending: Gastroenterology | Admitting: Gastroenterology

## 2024-03-28 DIAGNOSIS — K7460 Unspecified cirrhosis of liver: Secondary | ICD-10-CM | POA: Diagnosis present

## 2024-03-28 DIAGNOSIS — R7989 Other specified abnormal findings of blood chemistry: Secondary | ICD-10-CM | POA: Diagnosis present

## 2024-03-29 LAB — CBC WITH DIFFERENTIAL/PLATELET
Basophils Absolute: 0.1 x10E3/uL (ref 0.0–0.2)
Basos: 0 %
EOS (ABSOLUTE): 0.3 x10E3/uL (ref 0.0–0.4)
Eos: 2 %
Hematocrit: 44.2 % (ref 34.0–46.6)
Hemoglobin: 14 g/dL (ref 11.1–15.9)
Immature Grans (Abs): 0 x10E3/uL (ref 0.0–0.1)
Immature Granulocytes: 0 %
Lymphocytes Absolute: 4.3 x10E3/uL — ABNORMAL HIGH (ref 0.7–3.1)
Lymphs: 31 %
MCH: 31.3 pg (ref 26.6–33.0)
MCHC: 31.7 g/dL (ref 31.5–35.7)
MCV: 99 fL — ABNORMAL HIGH (ref 79–97)
Monocytes Absolute: 0.9 x10E3/uL (ref 0.1–0.9)
Monocytes: 6 %
Neutrophils Absolute: 8.2 x10E3/uL — ABNORMAL HIGH (ref 1.4–7.0)
Neutrophils: 61 %
Platelets: 252 x10E3/uL (ref 150–450)
RBC: 4.47 x10E6/uL (ref 3.77–5.28)
RDW: 12.9 % (ref 11.7–15.4)
WBC: 13.7 x10E3/uL — ABNORMAL HIGH (ref 3.4–10.8)

## 2024-03-29 LAB — COMPREHENSIVE METABOLIC PANEL WITH GFR
ALT: 31 IU/L (ref 0–32)
AST: 27 IU/L (ref 0–40)
Albumin: 4.6 g/dL (ref 3.9–4.9)
Alkaline Phosphatase: 98 IU/L (ref 44–121)
BUN/Creatinine Ratio: 15 (ref 12–28)
BUN: 11 mg/dL (ref 8–27)
Bilirubin Total: 0.2 mg/dL (ref 0.0–1.2)
CO2: 23 mmol/L (ref 20–29)
Calcium: 10.1 mg/dL (ref 8.7–10.3)
Chloride: 101 mmol/L (ref 96–106)
Creatinine, Ser: 0.71 mg/dL (ref 0.57–1.00)
Globulin, Total: 2.7 g/dL (ref 1.5–4.5)
Glucose: 81 mg/dL (ref 70–99)
Potassium: 4.8 mmol/L (ref 3.5–5.2)
Sodium: 139 mmol/L (ref 134–144)
Total Protein: 7.3 g/dL (ref 6.0–8.5)
eGFR: 94 mL/min/1.73 (ref >59)

## 2024-03-29 LAB — PROTIME-INR
INR: 0.9 (ref 0.9–1.2)
Prothrombin Time: 10.3 s (ref 9.1–12.0)

## 2024-03-29 LAB — AFP TUMOR MARKER: AFP, Serum, Tumor Marker: 6.4 ng/mL (ref 0.0–9.2)

## 2024-03-30 ENCOUNTER — Telehealth: Payer: Self-pay

## 2024-03-30 NOTE — Telephone Encounter (Signed)
 Pt dropped off pt assistance forms for [employer] . In your box to be signed.

## 2024-04-02 NOTE — Telephone Encounter (Signed)
 Forms have been faxed to [facility] for processing.

## 2024-04-02 NOTE — Telephone Encounter (Signed)
 signed

## 2024-04-06 ENCOUNTER — Ambulatory Visit: Payer: Self-pay | Admitting: Gastroenterology

## 2024-04-11 ENCOUNTER — Ambulatory Visit: Admitting: Gastroenterology

## 2024-07-23 ENCOUNTER — Ambulatory Visit: Admitting: Gastroenterology

## 2024-07-31 ENCOUNTER — Telehealth: Payer: Self-pay

## 2024-07-31 NOTE — Telephone Encounter (Signed)
 error

## 2024-08-07 ENCOUNTER — Ambulatory Visit: Admitting: Gastroenterology

## 2024-09-17 ENCOUNTER — Ambulatory Visit: Admitting: Gastroenterology
